# Patient Record
Sex: Female | Born: 1975 | Race: Black or African American | Hispanic: No | Marital: Married | Smoking: Current some day smoker
Health system: Southern US, Community
[De-identification: ages and names within clinical notes are randomized; demographics above are authoritative.]

## PROBLEM LIST (undated history)

## (undated) ENCOUNTER — Emergency Department (HOSPITAL_COMMUNITY): Payer: 59 | Source: Home / Self Care

## (undated) DIAGNOSIS — K219 Gastro-esophageal reflux disease without esophagitis: Secondary | ICD-10-CM

## (undated) DIAGNOSIS — R51 Headache: Secondary | ICD-10-CM

## (undated) DIAGNOSIS — K644 Residual hemorrhoidal skin tags: Secondary | ICD-10-CM

## (undated) DIAGNOSIS — M659 Synovitis and tenosynovitis, unspecified: Secondary | ICD-10-CM

## (undated) DIAGNOSIS — M65931 Unspecified synovitis and tenosynovitis, right forearm: Secondary | ICD-10-CM

## (undated) DIAGNOSIS — F419 Anxiety disorder, unspecified: Secondary | ICD-10-CM

## (undated) DIAGNOSIS — T7840XA Allergy, unspecified, initial encounter: Secondary | ICD-10-CM

## (undated) DIAGNOSIS — I10 Essential (primary) hypertension: Secondary | ICD-10-CM

## (undated) HISTORY — DX: Allergy, unspecified, initial encounter: T78.40XA

## (undated) HISTORY — PX: AUGMENTATION MAMMAPLASTY: SUR837

## (undated) HISTORY — PX: HEMORROIDECTOMY: SUR656

## (undated) HISTORY — DX: Headache: R51

## (undated) HISTORY — PX: ABDOMINAL HYSTERECTOMY: SHX81

## (undated) HISTORY — DX: Anxiety disorder, unspecified: F41.9

## (undated) HISTORY — DX: Residual hemorrhoidal skin tags: K64.4

---

## 1999-03-17 ENCOUNTER — Encounter: Admission: RE | Admit: 1999-03-17 | Discharge: 1999-03-25 | Payer: Self-pay

## 1999-09-23 ENCOUNTER — Inpatient Hospital Stay (HOSPITAL_COMMUNITY): Admission: AD | Admit: 1999-09-23 | Discharge: 1999-09-23 | Payer: Self-pay | Admitting: Obstetrics and Gynecology

## 1999-10-06 ENCOUNTER — Other Ambulatory Visit: Admission: RE | Admit: 1999-10-06 | Discharge: 1999-10-06 | Payer: Self-pay | Admitting: Obstetrics and Gynecology

## 2000-02-25 ENCOUNTER — Ambulatory Visit (HOSPITAL_COMMUNITY): Admission: RE | Admit: 2000-02-25 | Discharge: 2000-02-25 | Payer: Self-pay | Admitting: Obstetrics & Gynecology

## 2000-02-25 ENCOUNTER — Encounter: Payer: Self-pay | Admitting: Obstetrics & Gynecology

## 2000-04-15 ENCOUNTER — Inpatient Hospital Stay (HOSPITAL_COMMUNITY): Admission: AD | Admit: 2000-04-15 | Discharge: 2000-04-15 | Payer: Self-pay | Admitting: Obstetrics & Gynecology

## 2000-04-21 ENCOUNTER — Inpatient Hospital Stay (HOSPITAL_COMMUNITY): Admission: AD | Admit: 2000-04-21 | Discharge: 2000-04-23 | Payer: Self-pay | Admitting: Obstetrics and Gynecology

## 2000-06-18 ENCOUNTER — Emergency Department (HOSPITAL_COMMUNITY): Admission: EM | Admit: 2000-06-18 | Discharge: 2000-06-18 | Payer: Self-pay | Admitting: Emergency Medicine

## 2000-06-18 ENCOUNTER — Encounter: Payer: Self-pay | Admitting: Emergency Medicine

## 2000-08-04 ENCOUNTER — Emergency Department (HOSPITAL_COMMUNITY): Admission: EM | Admit: 2000-08-04 | Discharge: 2000-08-04 | Payer: Self-pay | Admitting: Emergency Medicine

## 2001-01-08 ENCOUNTER — Encounter: Payer: Self-pay | Admitting: Sports Medicine

## 2001-01-08 ENCOUNTER — Encounter: Admission: RE | Admit: 2001-01-08 | Discharge: 2001-01-08 | Payer: Self-pay | Admitting: Sports Medicine

## 2002-04-15 ENCOUNTER — Emergency Department (HOSPITAL_COMMUNITY): Admission: EM | Admit: 2002-04-15 | Discharge: 2002-04-15 | Payer: Self-pay | Admitting: Emergency Medicine

## 2005-10-11 ENCOUNTER — Encounter (INDEPENDENT_AMBULATORY_CARE_PROVIDER_SITE_OTHER): Payer: Self-pay | Admitting: Specialist

## 2005-10-11 ENCOUNTER — Encounter (INDEPENDENT_AMBULATORY_CARE_PROVIDER_SITE_OTHER): Payer: Self-pay | Admitting: *Deleted

## 2005-10-11 ENCOUNTER — Ambulatory Visit (HOSPITAL_COMMUNITY): Admission: AD | Admit: 2005-10-11 | Discharge: 2005-10-11 | Payer: Self-pay | Admitting: Obstetrics and Gynecology

## 2006-01-31 ENCOUNTER — Emergency Department (HOSPITAL_COMMUNITY): Admission: EM | Admit: 2006-01-31 | Discharge: 2006-01-31 | Payer: Self-pay | Admitting: Emergency Medicine

## 2007-02-15 HISTORY — PX: BREAST ENHANCEMENT SURGERY: SHX7

## 2007-04-25 IMAGING — CR DG FOOT EXAM
1 series · 3 of 3 positions shown · non-contrast
Comparison: NONE

CLINICAL DATA: Injury. 

RIGHT GREAT TOE

[Series 1: view not recorded · 0.17mm/px · 3 of 3 slices shown]
[im 1/3]
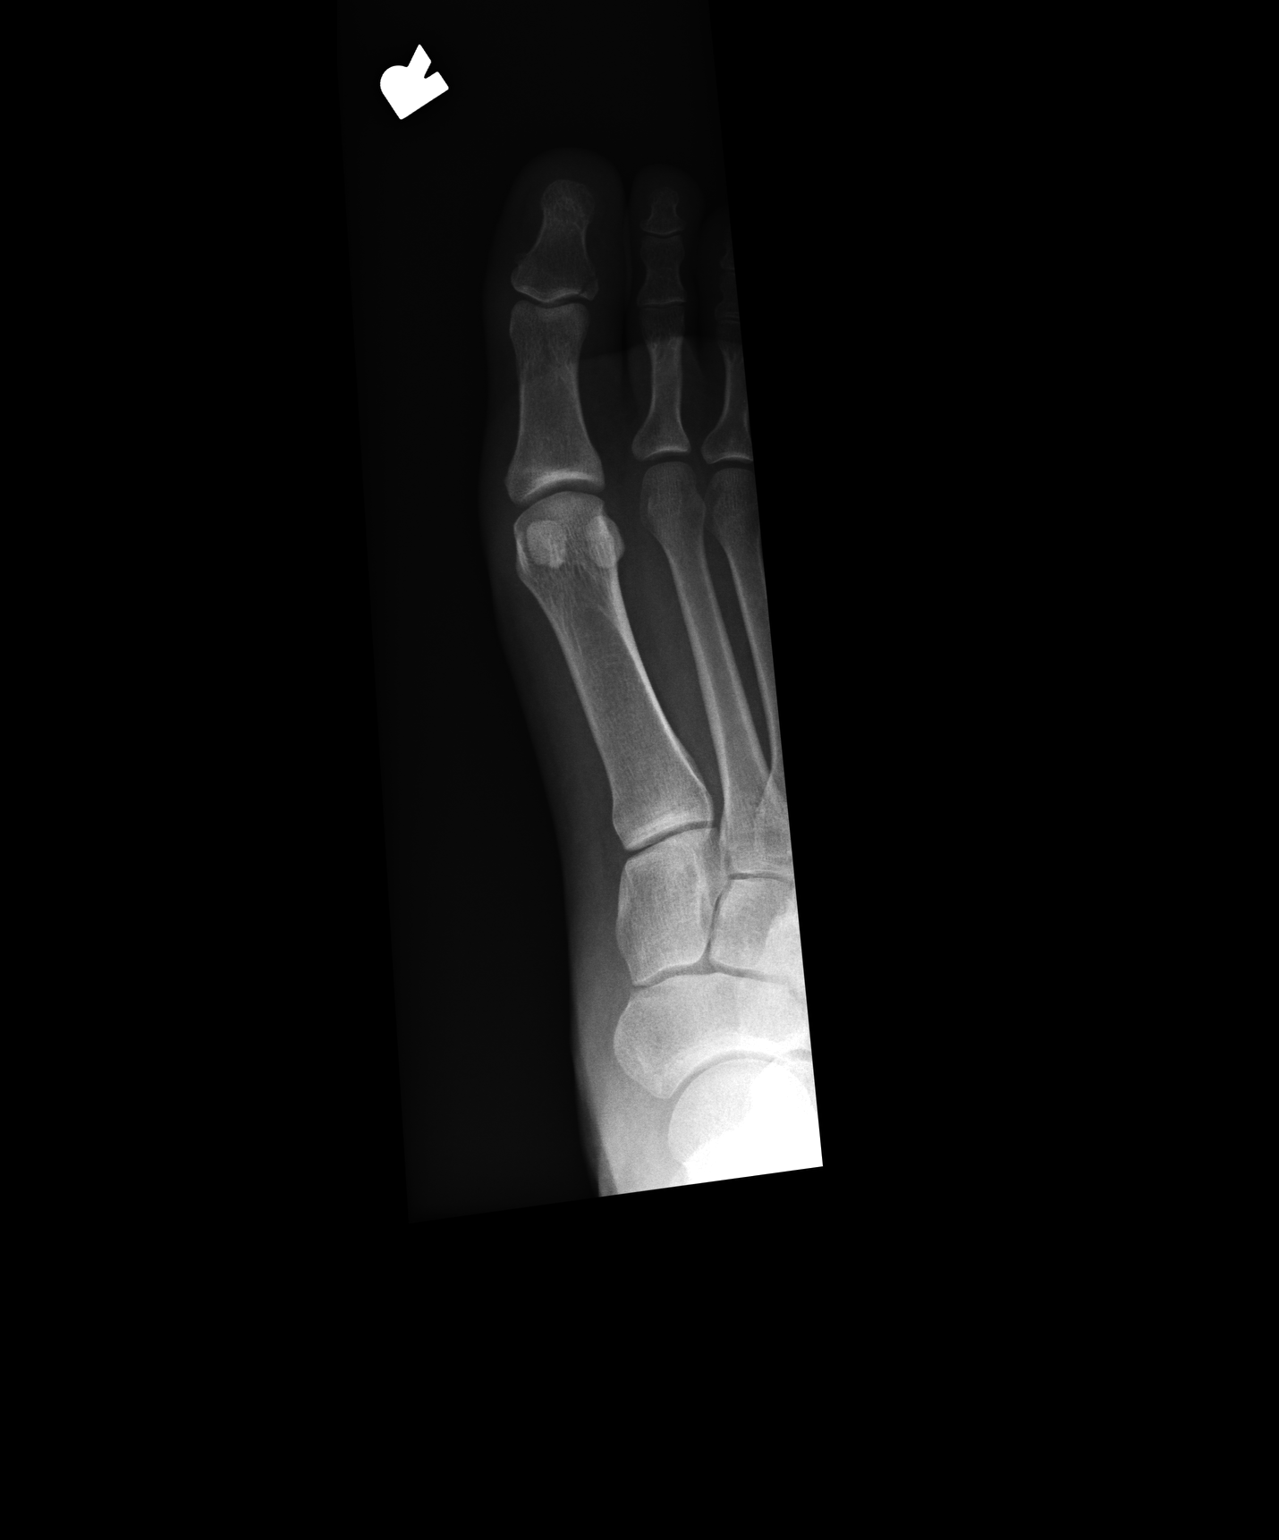
[im 2/3]
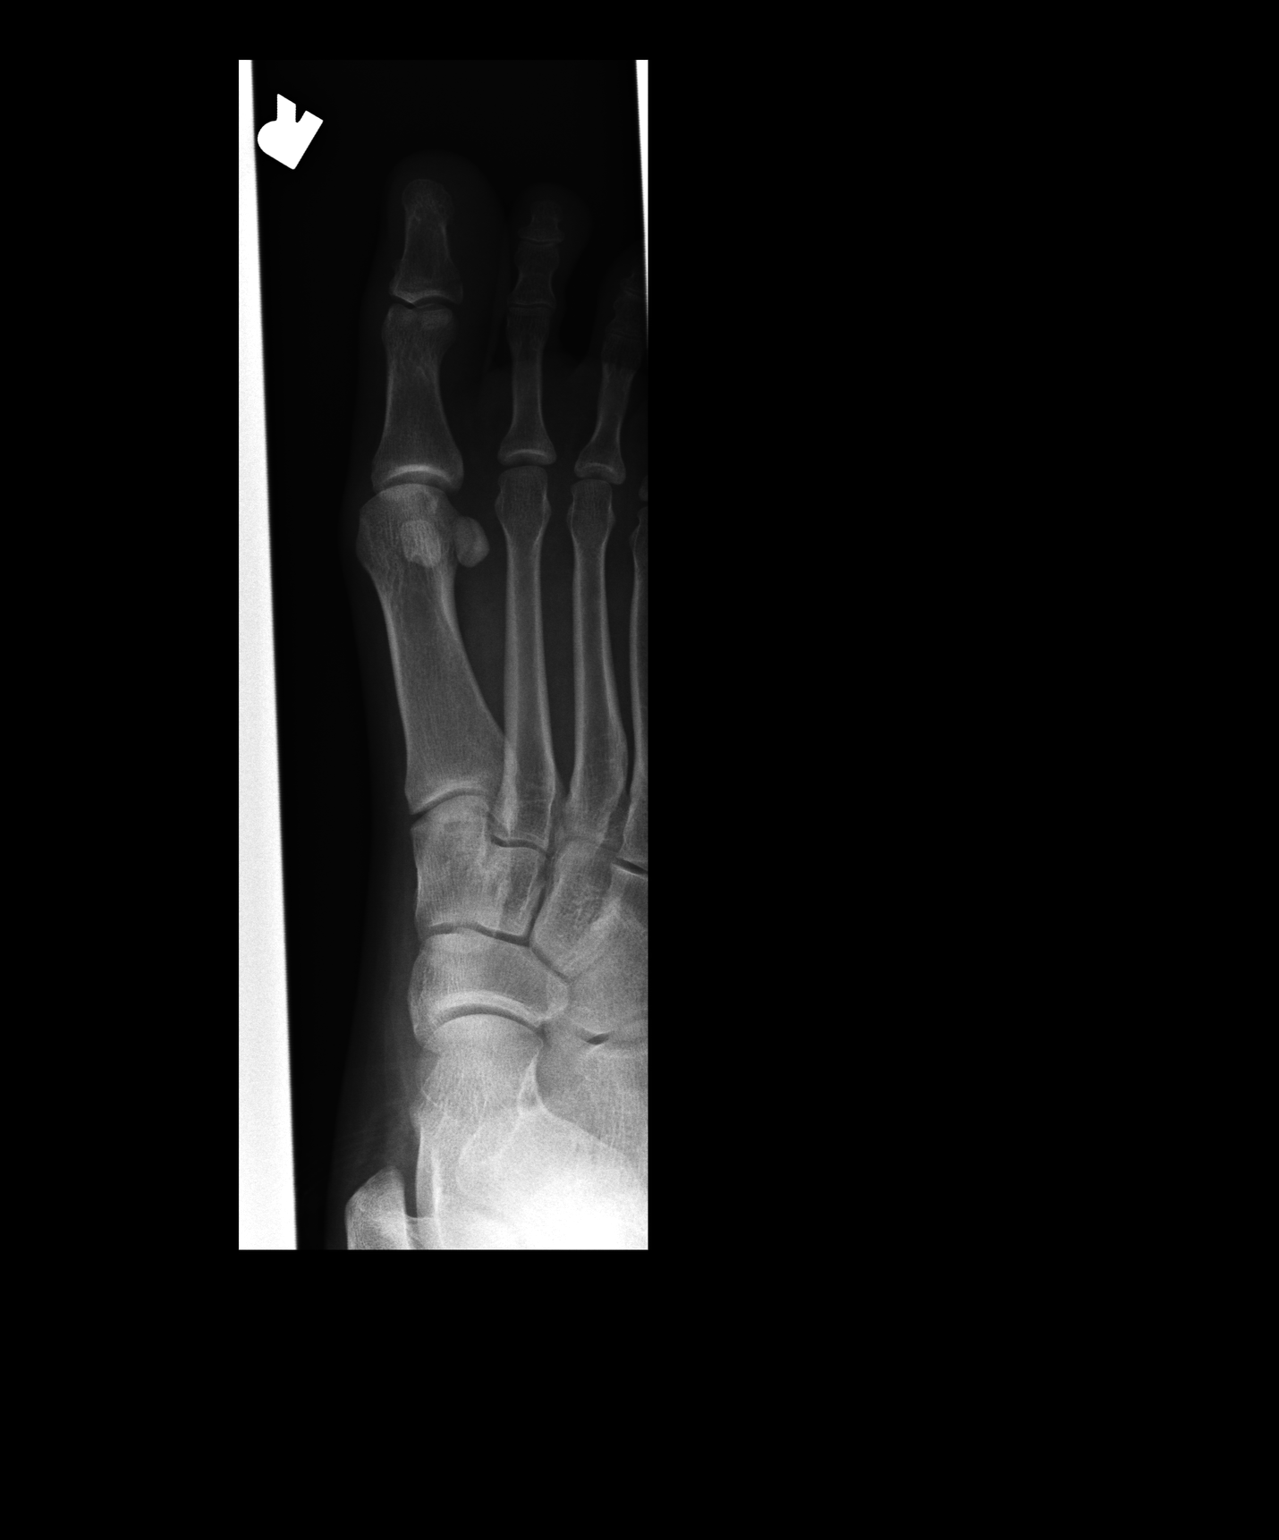
[im 3/3]
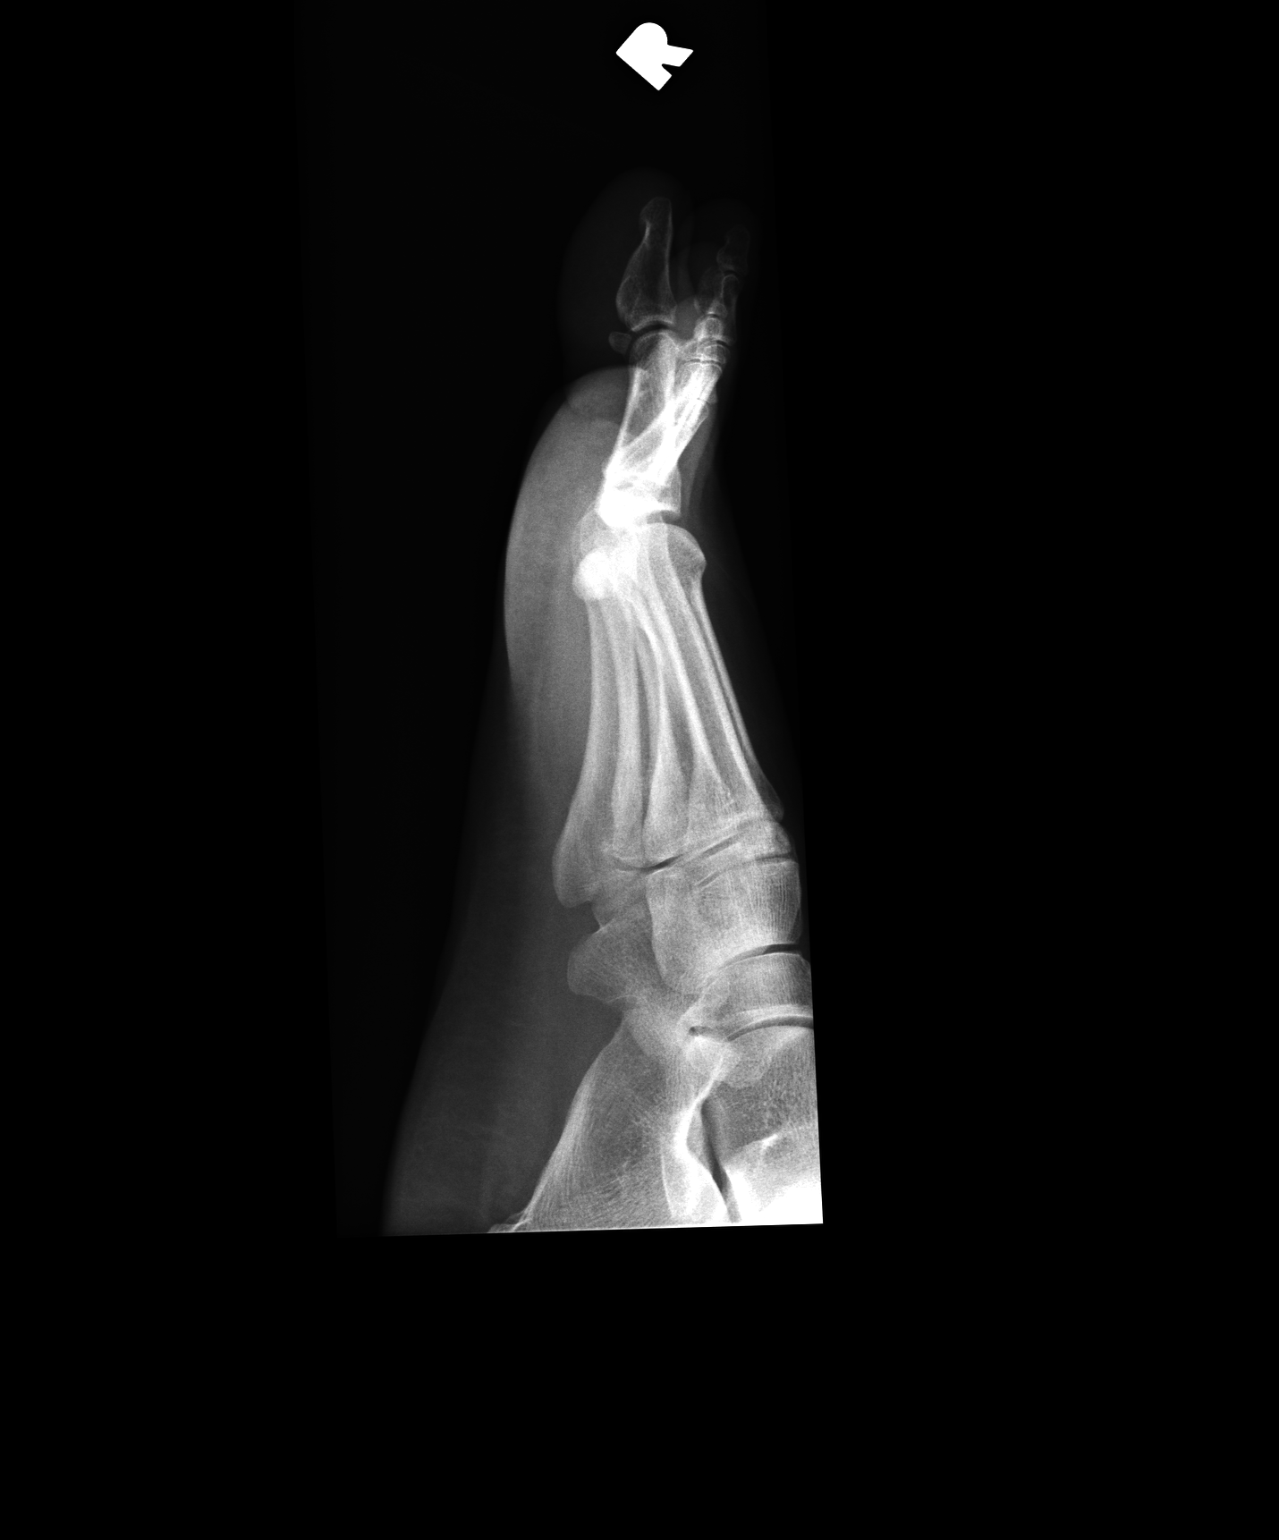

[3 of 3 positions shown; findings below may reference images not displayed]

FINDINGS: There is a non-displaced fracture at the base of the 
distal phalanx laterally, extending into the articular surface. 
The fracture fragment measures approximately 3-4 mm in size. No 
other evidence of fracture is identified.
IMPRESSION: Non-displaced distal phalanx fracture of the great 
toe. SEDLARSTVO electronically reviewed on [DATE] 
Dict Date: [DATE]  Tran Date:  [DATE] DAS  [REDACTED]

## 2009-03-11 ENCOUNTER — Ambulatory Visit: Payer: Self-pay | Admitting: Diagnostic Radiology

## 2009-03-11 ENCOUNTER — Emergency Department (HOSPITAL_BASED_OUTPATIENT_CLINIC_OR_DEPARTMENT_OTHER): Admission: EM | Admit: 2009-03-11 | Discharge: 2009-03-11 | Payer: Self-pay | Admitting: Emergency Medicine

## 2009-05-24 ENCOUNTER — Emergency Department (HOSPITAL_BASED_OUTPATIENT_CLINIC_OR_DEPARTMENT_OTHER): Admission: EM | Admit: 2009-05-24 | Discharge: 2009-05-24 | Payer: Self-pay | Admitting: Emergency Medicine

## 2009-05-26 ENCOUNTER — Emergency Department (HOSPITAL_COMMUNITY): Admission: EM | Admit: 2009-05-26 | Discharge: 2009-05-26 | Payer: Self-pay | Admitting: Family Medicine

## 2009-05-26 ENCOUNTER — Emergency Department (HOSPITAL_BASED_OUTPATIENT_CLINIC_OR_DEPARTMENT_OTHER): Admission: EM | Admit: 2009-05-26 | Discharge: 2009-05-26 | Payer: Self-pay | Admitting: Emergency Medicine

## 2010-02-14 HISTORY — PX: PARTIAL HYSTERECTOMY: SHX80

## 2010-04-29 ENCOUNTER — Encounter (HOSPITAL_COMMUNITY)
Admission: RE | Admit: 2010-04-29 | Discharge: 2010-04-29 | Disposition: A | Discharge status: Home / Self Care | Payer: 59 | Source: Ambulatory Visit | Attending: Obstetrics and Gynecology | Admitting: Obstetrics and Gynecology

## 2010-04-29 LAB — BASIC METABOLIC PANEL
GFR calc Af Amer: >60 mL/min (ref >60)
GFR calc non Af Amer: >60 mL/min (ref >60)
Potassium: 3.9 mEq/L (ref 3.5–5.1)
Sodium: 137 mEq/L (ref 135–145)

## 2010-04-29 LAB — CBC
Platelets: 225 10*3/uL (ref 150–400)
RDW: 13.1 % (ref 11.5–15.5)
WBC: 3.5 10*3/uL — ABNORMAL LOW (ref 4.0–10.5)

## 2010-04-29 LAB — SURGICAL PCR SCREEN: Staphylococcus aureus: NEGATIVE

## 2010-05-03 LAB — DIFFERENTIAL
Basophils Relative: 1 % (ref 0–1)
Lymphs Abs: 0.7 10*3/uL (ref 0.7–4.0)
Monocytes Relative: 14 % — ABNORMAL HIGH (ref 3–12)
Neutro Abs: 2 10*3/uL (ref 1.7–7.7)
Neutrophils Relative %: 62 % (ref 43–77)

## 2010-05-03 LAB — CBC
Platelets: 208 10*3/uL (ref 150–400)
RBC: 5.06 MIL/uL (ref 3.87–5.11)
WBC: 3.3 10*3/uL — ABNORMAL LOW (ref 4.0–10.5)

## 2010-05-03 LAB — D-DIMER, QUANTITATIVE: D-Dimer, Quant: 0.68 ug/mL-FEU — ABNORMAL HIGH (ref 0.00–0.48)

## 2010-05-06 ENCOUNTER — Ambulatory Visit (HOSPITAL_COMMUNITY)
Admission: RE | Admit: 2010-05-06 | Discharge: 2010-05-07 | Disposition: A | Discharge status: Home / Self Care | Payer: 59 | Source: Ambulatory Visit | Attending: Obstetrics and Gynecology | Admitting: Obstetrics and Gynecology

## 2010-05-06 ENCOUNTER — Other Ambulatory Visit: Payer: Self-pay | Admitting: Obstetrics and Gynecology

## 2010-05-06 DIAGNOSIS — N920 Excessive and frequent menstruation with regular cycle: Secondary | ICD-10-CM | POA: Insufficient documentation

## 2010-05-06 DIAGNOSIS — N8000 Endometriosis of the uterus, unspecified: Secondary | ICD-10-CM | POA: Insufficient documentation

## 2010-05-06 DIAGNOSIS — Z01818 Encounter for other preprocedural examination: Secondary | ICD-10-CM | POA: Insufficient documentation

## 2010-05-06 DIAGNOSIS — Z01812 Encounter for preprocedural laboratory examination: Secondary | ICD-10-CM | POA: Insufficient documentation

## 2010-05-06 DIAGNOSIS — IMO0002 Reserved for concepts with insufficient information to code with codable children: Secondary | ICD-10-CM | POA: Insufficient documentation

## 2010-05-06 LAB — HCG, SERUM, QUALITATIVE: Preg, Serum: NEGATIVE

## 2010-05-07 LAB — CBC
HCT: 35.4 % — ABNORMAL LOW (ref 36.0–46.0)
RDW: 13.2 % (ref 11.5–15.5)
WBC: 10 10*3/uL (ref 4.0–10.5)

## 2010-05-08 NOTE — H&P (Signed)
  NAMEBRUCHA, Judith [name]            ACCOUNT NO.:  [id number]  MEDICAL RECORD NO.:  [id number]        PATIENT TYPE:  WAMB  LOCATION:                                [facility]  PHYSICIAN:  Juluis Mire, M.D.        DATE OF BIRTH:  DATE OF ADMISSION:  05/06/2010 DATE OF DISCHARGE:                             HISTORY & PHYSICAL   The patient is a 35 year old gravida 4, para 3, abortus 2 female presents for LAVH.  In relation to present admission, the patient has been having increasing problems with menorrhagia.  Associated with this has been pain with intercourse during deep penetration.  Ultrasound evaluation has been high suggestive of adenomyosis.  We could not exclude other conditions such as endometriosis.  The symptoms of pain with intercourse and menorrhagia becoming limiting from the patient's standpoint.  We have discussed various options.  We have discussed the use of birth control pills, although that is somewhat limited by her tobacco use.  Discussed the use of a Marine IUD also discussed, ablated technique with associated laparoscopy more and then the more aggressive laparoscopic- assisted vaginal hysterectomy for which she is admitted at the present time.  In terms of allergy, has no known drug allergies.  MEDICATIONS:  She has been on hydrochlorothiazide.  PAST MEDICAL HISTORY:  Usual childhood diseases.  No significant sequelae.  No previous surgical history.  OBSTETRICAL HISTORY:  Three vaginal deliveries.  SOCIAL HISTORY:  One-pack per day tobacco use.  Occasional alcohol use.  FAMILY HISTORY:  Noncontributory.  REVIEW OF SYSTEMS:  Noncontributory.  PHYSICAL EXAMINATION:  GENERAL:  The patient is afebrile, stable vital signs. HEENT:  The patient is normocephalic.  Pupils equal, round, reactive to light and accommodation.  Extraocular movements were intact.  Sclerae and conjunctivae are clear.  Oropharynx clear. NECK:  Without thyromegaly. BREASTS:   Not examined. LUNGS:  Clear. CARDIOVASCULAR:  Regular rate without murmurs or gallops. ABDOMEN:  Benign.  No mass, organomegaly, or tenderness. PELVIC:  Normal external genitalia.  Vaginal mucosa is clear.  Cervix unremarkable.  Uterus normal size, shape, and contour.  Adnexa free of masses or tenderness. EXTREMITIES:  Trace edema. NEUROLOGIC:  Grossly within normal limits.  IMPRESSION:  Menorrhagia with associated dysmenorrhea secondary to adenomyosis.  PLAN:  The patient is to undergo laparoscopic-assisted vaginal hysterectomy.  Risks of surgery have been discussed, including the risk of infection.  The risk of hemorrhage that could require transfusion with associated risk of AIDS or hepatitis.  Risk of injury to adjacent organs including bladder, bowel, ureters that could require further exploratory surgery.  Risk of deep venous thrombosis and pulmonary embolus.  The patient expressed understanding of the indications and potential risk.     Juluis Mire, M.D.     JSM/MEDQ  D:  05/06/2010  T:  05/06/2010  Job:  [id number]  Electronically Signed by Richardean Chimera M.D. on 05/08/2010 06:06:10 AM

## 2010-05-08 NOTE — Op Note (Signed)
Judith [name], Judith [name]            ACCOUNT NO.:  [id number]  MEDICAL RECORD NO.:  [id number]           PATIENT TYPE:  O  LOCATION:  9318                          [facility]ACILITY:  [facility]  PHYSICIAN:  Juluis Mire, M.D.   DATE OF BIRTH:  [birth year]  DATE OF PROCEDURE:  05/06/2010 DATE OF DISCHARGE:                              OPERATIVE REPORT   PREOPERATIVE DIAGNOSES:  Menorrhagia and dyspareunia, probably secondary to adenomyosis.  POSTOPERATIVE DIAGNOSES:  Menorrhagia and dyspareunia, probably secondary to adenomyosis.  OPERATIVE PROCEDURE:  Laparoscopic-assisted vaginal hysterectomy.  SURGEON:  Juluis Mire, MD  ASSISTANT:  Freddy Finner, MD  ESTIMATED BLOOD LOSS:  400 mL.  Packs were none.  Drains included urethral Foley.  INTRAOPERATIVE BLOOD PLACED:  None.  COMPLICATIONS:  None.  INDICATIONS:  Dictated in the history and physical.  PROCEDURE IN DETAILS:  The patient was taken to the OR, placed in supine position.  After satisfactory level of general endotracheal anesthesia was obtained, the patient was placed in dorsal lithotomy position using the Allen stirrups.  Abdomen, perineum and vagina were prepped out with Betadine.  Bladder was emptied with in-and-out catheterization.  A Hulka tenaculum was put in place and secured.  The patient was draped in sterile field.  Subumbilical incision was made with a knife.  Veress needle was introduced into the abdominal cavity.  Abdomen was inflated with approximately 3 L of carbon dioxide.  Veress needle was then removed.  A 10/11 trocar was inserted.  Laparoscope was then inserted. Visualization revealed no evidence of injury to adjacent organs. Appendix was visualized and noted to be normal.  Upper abdomen including the liver, tip of the gallbladder were clear.  Uterus, tubes and ovaries were unremarkable.  A 5-mm trocar was put in place in suprapubic area under direct visualization.  Using the EnSeal, the  utero-ovarian pedicles and round ligaments on each sides were cauterized and incised. We had good separation of the uterus.  At this point in time we decided to go vaginally.  Laparoscope was removed.  The abdomen was deinflated with carbon dioxide.  Legs were repositioned.  The Hulka tenaculum was removed.  A weighted speculum was placed in vaginal vault.  Cul-de-sac was entered sharply.  Both uterosacral ligaments were clamped, cut and suture ligated with 0 Vicryl.  Reflection of the vaginal mucosa anteriorly was incised.  Bladder was dissected superiorly.  Paracervical tissue was clamped, cut and suture ligated with 0 Vicryl.  Vesicouterine space was identified and entered sharply.  Retractor was put in place. Using a clamp, cut and tie technique with suture ligature of 0 Vicryl, the parametrium was serially separated in size of the uterus.  Uterus was then flipped, remaining pedicles were clamped and cut.  Uterus, cervix were passed off the operative field.  Held pedicles were secured with free ties of 0 Vicryl.  Areas of bleeding brought under controlwith figure-of-eights of 0 Vicryl.  Vaginal mucosa was then closed with interrupted figure-of-eights of 2-0 Monocryl.  At this point in time Foley was placed to straight drain.  Urine output was clear and adequate.  The patient's legs were repositioned.  Laparoscope was reintroduced.  We irrigated the pelvis.  We had some oozing from the vaginal cuff, brought under control with the bipolar.  Both ovaries seemed to be hemostatically intact.  At this point in time we deflated the abdomen and reinflated, so no further bleeding.  The abdomen was then deinflated with carbon dioxide.  All trocars were removed. Subumbilical incision was closed with interrupted subcuticulars of 4-0 Vicryl.  Suprapubic incision was closed with Dermabond.  The patient was taken out of dorsal lithotomy position.  Once alert and extubated, transferred to recovery  room in good condition.  Sponge, instrument and needle count was reported as correct by circulating nurse x2.     Juluis Mire, M.D.     JSM/MEDQ  D:  05/06/2010  T:  05/07/2010  Job:  [id number]  Electronically Signed by Richardean Chimera M.D. on 05/08/2010 06:06:12 AM

## 2010-05-08 NOTE — Discharge Summary (Signed)
  [name], [name]            ACCOUNT NO.:  [id number]  MEDICAL RECORD NO.:  [id number]           PATIENT TYPE:  O  LOCATION:  9318                          [facility]ILITY:  [facility]  PHYSICIAN:  Juluis Mire, M.D.   DATE OF BIRTH:  August 11, 1975  DATE OF ADMISSION:  05/06/2010 DATE OF DISCHARGE:  05/07/2010                              DISCHARGE SUMMARY   ADMITTING DIAGNOSES:  Menorrhagia and dysmenorrhea secondary to adenomyosis.  DISCHARGE DIAGNOSIS:  Menorrhagia and dysmenorrhea secondary to adenomyosis.  OPERATIVE PROCEDURE:  Laparoscopic-assisted vaginal hysterectomy.  For complete history and physical, please see dictated note.  COURSE IN THE HOSPITAL:  The patient underwent the above-noted surgery. She did well and was discharged home on her first postop day.  At that time, her hemoglobin was 11.6.  She was afebrile, stable vital signs, and abdomen was soft with active bowel sounds.  She was having minimal vaginal bleeding.  Her urine output the night had been adequate.  All incisions were clear.  COMPLICATION:  In terms of complications,  none were encountered in the hospital, the patient was discharged home in stable condition.  DISPOSITION:  The patient is to avoid heavy lifting, vaginal entry, or driving a car.  Discharged home on Percocet, she needs it for pain as well as the stool softener.  She is instructed to call should there be signs of infection with fever.  She should call with nausea, vomiting, active vaginal bleeding, or excessive pain.  Also discussed signs and symptoms of deep venous thrombosis or pulmonary embolus.     Juluis Mire, M.D.     JSM/MEDQ  D:  05/07/2010  T:  05/08/2010  Job:  [id number]  Electronically Signed by Richardean Chimera M.D. on 05/08/2010 06:06:15 AM

## 2010-05-09 ENCOUNTER — Inpatient Hospital Stay (HOSPITAL_COMMUNITY)
Admission: AD | Admit: 2010-05-09 | Discharge: 2010-05-09 | Disposition: A | Discharge status: Home / Self Care | Payer: 59 | Source: Ambulatory Visit | Attending: Obstetrics and Gynecology | Admitting: Obstetrics and Gynecology

## 2010-05-09 DIAGNOSIS — N9989 Other postprocedural complications and disorders of genitourinary system: Secondary | ICD-10-CM

## 2010-05-09 DIAGNOSIS — IMO0002 Reserved for concepts with insufficient information to code with codable children: Secondary | ICD-10-CM | POA: Insufficient documentation

## 2010-05-09 DIAGNOSIS — N390 Urinary tract infection, site not specified: Secondary | ICD-10-CM | POA: Insufficient documentation

## 2010-05-09 LAB — URINALYSIS, ROUTINE W REFLEX MICROSCOPIC
Hgb urine dipstick: NEGATIVE
Nitrite: POSITIVE — AB
Protein, ur: NEGATIVE mg/dL
Specific Gravity, Urine: 1.015 (ref 1.005–1.030)
Urobilinogen, UA: 1 mg/dL (ref 0.0–1.0)

## 2010-05-09 LAB — URINE MICROSCOPIC-ADD ON

## 2010-05-10 ENCOUNTER — Inpatient Hospital Stay (HOSPITAL_COMMUNITY): Payer: 59

## 2010-05-10 ENCOUNTER — Observation Stay (HOSPITAL_COMMUNITY)
Admission: AD | Admit: 2010-05-10 | Discharge: 2010-05-11 | Disposition: A | Discharge status: Home / Self Care | Payer: 59 | Source: Ambulatory Visit | Attending: Obstetrics and Gynecology | Admitting: Obstetrics and Gynecology

## 2010-05-10 DIAGNOSIS — K560 Paralytic ileus: Secondary | ICD-10-CM | POA: Insufficient documentation

## 2010-05-10 DIAGNOSIS — R109 Unspecified abdominal pain: Secondary | ICD-10-CM | POA: Insufficient documentation

## 2010-05-10 DIAGNOSIS — Y838 Other surgical procedures as the cause of abnormal reaction of the patient, or of later complication, without mention of misadventure at the time of the procedure: Secondary | ICD-10-CM | POA: Insufficient documentation

## 2010-05-10 DIAGNOSIS — K929 Disease of digestive system, unspecified: Principal | ICD-10-CM | POA: Insufficient documentation

## 2010-05-10 LAB — DIFFERENTIAL
Basophils Absolute: 0 10*3/uL (ref 0.0–0.1)
Basophils Relative: 0 % (ref 0–1)
Lymphocytes Relative: 7 % — ABNORMAL LOW (ref 12–46)
Neutro Abs: 11.8 10*3/uL — ABNORMAL HIGH (ref 1.7–7.7)
Neutrophils Relative %: 86 % — ABNORMAL HIGH (ref 43–77)

## 2010-05-10 LAB — URINALYSIS, ROUTINE W REFLEX MICROSCOPIC
Glucose, UA: NEGATIVE mg/dL
Hgb urine dipstick: NEGATIVE
Protein, ur: NEGATIVE mg/dL
Specific Gravity, Urine: 1.025 (ref 1.005–1.030)

## 2010-05-10 LAB — URINE MICROSCOPIC-ADD ON

## 2010-05-10 LAB — CBC
HCT: 35.7 % — ABNORMAL LOW (ref 36.0–46.0)
Hemoglobin: 12.1 g/dL (ref 12.0–15.0)
MCHC: 33.9 g/dL (ref 30.0–36.0)
RBC: 4.39 MIL/uL (ref 3.87–5.11)
WBC: 13.7 10*3/uL — ABNORMAL HIGH (ref 4.0–10.5)

## 2010-05-11 LAB — URINE CULTURE

## 2010-07-02 NOTE — H&P (Signed)
Judith [name], Judith [name]            ACCOUNT NO.:  [id number]   MEDICAL RECORD NO.:  [id number]          PATIENT TYPE:  MAT   LOCATION:  [facility]                          [facility]ILITY:  [facility]   PHYSICIAN:  Juluis Mire, M.D.   DATE OF BIRTH:  February 03, 1976   DATE OF ADMISSION:  10/11/2005  DATE OF DISCHARGE:                                HISTORY & PHYSICAL   The patient is a 35 year old gravida 4, para 3 female last menstrual period  of June 1 and estimated date of confinement March 8 and estimated  gestational age of approximately 11 weeks.  She was seen in the office on  August 24 complaining of bleeding. Ultrasound revealed a fetal pole  measuring 8 weeks and 6 days, no cardiac activity was seen.  This was  consistent with a nonviable first trimester pregnancy and the patient now  presents for dilation and evacuation.   ALLERGIES:  NO KNOWN DRUG ALLERGIES.   MEDICATIONS:  Prenatal vitamins.   PRENATAL LABS:  Significant in that she is O positive.  Rhogam will not be  required.   PAST MEDICAL HISTORY:  She had usual childhood diseases, no significant  sequelae.  She has no significant surgical history.  She has had three  previous vaginal deliveries.   FAMILY HISTORY:  Noncontributory.   SOCIAL HISTORY:  No tobacco or alcohol use at present time.   REVIEW OF SYSTEMS:  Noncontributory.   PHYSICAL EXAMINATION:  VITAL SIGNS:  The patient is afebrile with stable  vital signs.  HEENT:  Normocephalic.  Pupils equal, round and reactive to light and  accommodation.  Extraocular movements are intact.  Sclerae and conjunctivae  clear.  Oropharynx clear.  NECK:  Without thyromegaly.  BREASTS:  No discrete masses but very glandular.  LUNGS:  Clear.  HEART:  Regular rate and rhythm without murmurs or gallops.  ABDOMEN:  Benign.  No mass, organomegaly or tenderness.  PELVIC:  Normal external genitalia.  Vaginal mucosa clear.  Cervix  unremarkable, uterus 8 weeks in size.  Adnexa unremarkable.  EXTREMITIES:  Trace edema.  NEUROLOGIC:  Grossly within normal limits.   IMPRESSION:  Nonviable first trimester pregnancy.   PLAN:  The patient will undergo dilation and evacuation. The risks have been  discussed including the risks of infection, the risks of hemorrhage that  could require transfusion with the risks of AIDs or hepatitis, uncontrolled  bleeding could lead to hysterectomy.  There is a risk of perforation leading  to injury to adjacent organs, specifically bowel that could require further  exploratory surgery.  There is a risk of deep venous thrombosis and  pulmonary embolus.  Again, the patient's blood type is O positive.  Rhogam  will not be required.      Juluis Mire, M.D.  Electronically Signed     JSM/MEDQ  D:  10/11/2005  T:  10/11/2005  Job:  [id number]

## 2010-07-02 NOTE — Op Note (Signed)
Judith [name], Judith [name]            ACCOUNT NO.:  [id number]   MEDICAL RECORD NO.:  [id number]          PATIENT TYPE:  AMB   LOCATION:  SDC                           [facility]   PHYSICIAN:  Juluis Mire, M.D.   DATE OF BIRTH:  10-01-1975   DATE OF PROCEDURE:  10/11/2005  DATE OF DISCHARGE:                                 OPERATIVE REPORT   PREOPERATIVE DIAGNOSIS:  Retained products of conception.   POSTOPERATIVE DIAGNOSIS:  Retained products of conception.   PROCEDURE:  Dilatation and evacuation.   SURGEON:  Juluis Mire, M.D.   ANESTHESIA:  Sedation.   ESTIMATED BLOOD LOSS:  Minimal.   PACKS/DRAINS:  None.   BLOOD REPLACED:  None.   INDICATIONS FOR PROCEDURE:  The patient is a 35 year old, gravida 4, para 3  female who was diagnosed with a nonviable intrauterine fetal demise. She was  scheduled for a D&E lunch, reported to [facility] with increasing bleeding and  passage of the products of conception. Ultrasound was performed and still  revealed retained products. She now presents for D&E. Blood type is O  positive. The risks were discussed including the risk of infection. The risk  of hemorrhage that could require transfusion, risk of AIDS of hepatitis,  risk of perforation that could lead to injury to adjacent organs requiring  exploratory surgery. The risk of deep venous thrombosis and pulmonary  embolus.   DESCRIPTION OF PROCEDURE:  The patient was taken to the OR and placed in  supine position and after sedation was placed in the dorsal lithotomy  position. The patient was draped in a sterile field, a speculum was placed  in the vaginal vault. The cervix was and vagina cleansed with Betadine.  Paracervical block was instituted using 1% Xylocaine, the cervix was grasped  with a single tooth tenaculum, uterus sounded to 8 cm. A size 8 curved  suction curette was introduced, no dilatation was required. We did obtain a  moderate amount of tissue and clots. We continued  this until no additional  tissue was obtained. Sharp curetting revealed the arteries to be clear. At  this point  in time, the speculum and single tooth tenaculum were removed, the patient  taken out of the dorsal lithotomy position and once alert transferred to the  recovery room in good condition. Sponge, instrument and needle count  reported as correct by circulating nurse x2.      Juluis Mire, M.D.  Electronically Signed     JSM/MEDQ  D:  10/11/2005  T:  10/11/2005  Job:  [id number]

## 2011-01-31 ENCOUNTER — Ambulatory Visit (INDEPENDENT_AMBULATORY_CARE_PROVIDER_SITE_OTHER): Payer: 59

## 2011-01-31 DIAGNOSIS — S60229A Contusion of unspecified hand, initial encounter: Secondary | ICD-10-CM

## 2011-01-31 DIAGNOSIS — M25549 Pain in joints of unspecified hand: Secondary | ICD-10-CM

## 2011-03-22 ENCOUNTER — Emergency Department (INDEPENDENT_AMBULATORY_CARE_PROVIDER_SITE_OTHER)
Admission: EM | Admit: 2011-03-22 | Discharge: 2011-03-22 | Disposition: A | Discharge status: Home / Self Care | Payer: Worker's Compensation | Source: Home / Self Care

## 2011-03-22 ENCOUNTER — Emergency Department (HOSPITAL_COMMUNITY): Payer: Self-pay

## 2011-03-22 ENCOUNTER — Encounter (HOSPITAL_COMMUNITY): Payer: Self-pay | Admitting: Cardiology

## 2011-03-22 ENCOUNTER — Emergency Department (INDEPENDENT_AMBULATORY_CARE_PROVIDER_SITE_OTHER): Payer: Worker's Compensation

## 2011-03-22 DIAGNOSIS — S8992XA Unspecified injury of left lower leg, initial encounter: Secondary | ICD-10-CM

## 2011-03-22 DIAGNOSIS — S99919A Unspecified injury of unspecified ankle, initial encounter: Secondary | ICD-10-CM

## 2011-03-22 DIAGNOSIS — S99929A Unspecified injury of unspecified foot, initial encounter: Secondary | ICD-10-CM

## 2011-03-22 MED ORDER — HYDROCODONE-ACETAMINOPHEN 5-325 MG PO TABS: 1.0000 | ORAL_TABLET | Freq: Four times a day (QID) | ORAL | Status: AC | PRN

## 2011-03-22 MED ORDER — HYDROCODONE-ACETAMINOPHEN 5-325 MG PO TABS: 2.0000 | ORAL_TABLET | ORAL | Status: DC | PRN

## 2011-03-22 MED ORDER — DICLOFENAC SODIUM 75 MG PO TBEC: 75.0000 mg | DELAYED_RELEASE_TABLET | Freq: Two times a day (BID) | ORAL | Status: AC

## 2011-03-22 NOTE — ED Notes (Signed)
Pt had twisting injury to left knee around 230pm today. She was loading trunk and turn one direction and her knee didn't. Denies numbness in left foot. Increased pain when bearing weight on left leg.

## 2011-03-22 NOTE — ED Provider Notes (Signed)
History     CSN: [id number]  Arrival date & time 03/22/11  1923   None     Chief Complaint  Patient presents with  . Knee Pain    (Consider location/radiation/quality/duration/timing/severity/associated sxs/prior treatment) HPI Comments: Pt presents with c/o Lt knee pain. She had a twisting injury to the knee this afternoon, and felt a pop. She has pain in medial aspect of knee. No prior history of knee problems. Pain worsens with weight bearing and extension. She has not taken anything for her discomfort.    History reviewed. No pertinent past medical history.  Past Surgical History  Procedure Date  . Vaginal delivery 1996, 1998, 2002  . Partial hysterectomy 2012    History reviewed. No pertinent family history.  History  Substance Use Topics  . Smoking status: Current Some Day Smoker -- 0.2 packs/day    Types: Cigarettes  . Smokeless tobacco: Not on file  . Alcohol Use: Yes     occas    OB History    Grav Para Term Preterm Abortions TAB SAB Ect Mult Living                  Review of Systems  Constitutional: Negative for fever and chills.  Musculoskeletal: Negative for joint swelling.  Skin: Negative for color change and wound.  Neurological: Negative for weakness and numbness.    Allergies  Review of patient's allergies indicates no known allergies.  Home Medications   Current Outpatient Rx  Name Route Sig Dispense Refill  . DICLOFENAC SODIUM 75 MG PO TBEC Oral Take 1 tablet (75 mg total) by mouth 2 (two) times daily. 20 tablet 0  . HYDROCODONE-ACETAMINOPHEN 5-325 MG PO TABS Oral Take 1 tablet by mouth every 6 (six) hours as needed for pain. 10 tablet 0    BP 118/87  Temp(Src) 98.7 F (37.1 C) (Oral)  Resp 16  SpO2 97%  Physical Exam  Nursing note and vitals reviewed. Constitutional: She appears well-developed and well-nourished. No distress.  HENT:  Head: Atraumatic.  Musculoskeletal:       Left knee: She exhibits bony tenderness (medial  superior tibia immediately inferior to joint line). She exhibits normal range of motion, no swelling, no effusion, no ecchymosis, no deformity, no laceration, no erythema, normal alignment, no LCL laxity, normal patellar mobility and no MCL laxity. tenderness found. Medial joint line tenderness noted. No lateral joint line, no MCL, no LCL and no patellar tendon tenderness noted.  Neurological: She is alert.  Skin: Skin is warm and dry. No rash noted. No erythema.  Psychiatric: She has a normal mood and affect.    ED Course  Procedures (including critical care time)  Labs Reviewed - No data to display No results found.   1. Left knee injury       MDM  Knee xray neg. Suspect medial meniscus injury based on mechanism of injury, symptoms and exam.         Melody Comas, PA 03/22/11 2209

## 2011-03-23 NOTE — ED Provider Notes (Signed)
Medical screening examination/treatment/procedure(s) were performed by non-physician practitioner and as supervising physician I was immediately available for consultation/collaboration.   [facility]; MD   Sharin Grave, MD 03/23/11 [phone]

## 2011-03-26 ENCOUNTER — Ambulatory Visit (INDEPENDENT_AMBULATORY_CARE_PROVIDER_SITE_OTHER): Payer: 59 | Admitting: Physician Assistant

## 2011-03-26 VITALS — BP 115/76 | HR 91 | Temp 99.2°F | Resp 16 | Ht 63.75 in | Wt 164.2 lb

## 2011-03-26 DIAGNOSIS — R05 Cough: Secondary | ICD-10-CM

## 2011-03-26 LAB — POCT CBC
Granulocyte percent: 76.9 %G (ref 37–80)
Hemoglobin: 13.6 g/dL (ref 12.2–16.2)
MCV: 86.3 fL (ref 80–97)
MID (cbc): 0.5 (ref 0–0.9)
MPV: 9.4 fL (ref 0–99.8)
POC MID %: 9.3 %M (ref 0–12)
Platelet Count, POC: 216 10*3/uL (ref 142–424)
RBC: 4.91 M/uL (ref 4.04–5.48)
WBC: 5.4 10*3/uL (ref 4.6–10.2)

## 2011-03-26 MED ORDER — HYDROCOD POLST-CHLORPHEN POLST 10-8 MG/5ML PO LQCR: 5.0000 mL | Freq: Two times a day (BID) | ORAL | Status: DC

## 2011-03-26 NOTE — Progress Notes (Signed)
  Subjective:    Patient ID: Judith [name], female    DOB: [birth year], 36 y.o.   MRN: [id number]  Sore Throat  This is a new (went running on Monday and this started right afterwards - has h/o allergies buit this feels completely different) problem. The current episode started in the past 7 days (started on Monday - getting worse as her cough worsens). The problem has been gradually worsening. Maximum temperature: subj fever. Associated symptoms include coughing. Pertinent negatives include no congestion, diarrhea, ear pain, headaches, shortness of breath or vomiting. Treatments tried: Mucinex - ? help.  Cough This is a new problem. The current episode started in the past 7 days (last 5 days). The problem has been gradually worsening. The problem occurs constantly (worse at night). The cough is non-productive. Associated symptoms include chest pain (2nd to cough - musculoskeletal), chills, postnasal drip and a sore throat (worse with cough). Pertinent negatives include no ear pain, fever, headaches, nasal congestion, rhinorrhea or shortness of breath. The symptoms are aggravated by nothing. She has tried OTC cough suppressant for the symptoms. The treatment provided mild relief.      Review of Systems  Constitutional: Positive for chills. Negative for fever.  HENT: Positive for sore throat (worse with cough) and postnasal drip. Negative for ear pain, congestion and rhinorrhea.   Respiratory: Positive for cough. Negative for shortness of breath.   Cardiovascular: Positive for chest pain (2nd to cough - musculoskeletal).  Gastrointestinal: Negative for vomiting and diarrhea.  Neurological: Negative for headaches.       Objective:   Physical Exam  Constitutional: She is oriented to person, place, and time. She appears well-developed and well-nourished.  HENT:  Head: Normocephalic and atraumatic.  Right Ear: Tympanic membrane, external ear and ear canal normal.  Left Ear: Tympanic membrane,  external ear and ear canal normal.  Nose: Nose normal. No rhinorrhea.  Neck: Normal range of motion.  Cardiovascular: Normal rate, regular rhythm and normal heart sounds.   Pulmonary/Chest: Effort normal and breath sounds normal.  Lymphadenopathy:    She has no cervical adenopathy.  Neurological: She is alert and oriented to person, place, and time.  Skin: Skin is warm and dry.  Psychiatric: She has a normal mood and affect. Her behavior is normal. Judgment and thought content normal.          Assessment & Plan:  ? Flu but due to length of illness will treat symptoms - if pts cough becomes productive and stays that way for the next 5 days a call for Abx because of smoking hx.  If she calls back zpack.  1. Cough  POCT CBC, chlorpheniramine-HYDROcodone (TUSSIONEX PENNKINETIC ER) 10-8 MG/5ML [facility]

## 2011-03-30 ENCOUNTER — Ambulatory Visit (INDEPENDENT_AMBULATORY_CARE_PROVIDER_SITE_OTHER): Payer: 59 | Admitting: Physician Assistant

## 2011-03-30 VITALS — BP 114/72 | HR 80 | Temp 98.0°F | Resp 16 | Ht 63.5 in | Wt 160.4 lb

## 2011-03-30 DIAGNOSIS — J9801 Acute bronchospasm: Secondary | ICD-10-CM

## 2011-03-30 DIAGNOSIS — R05 Cough: Secondary | ICD-10-CM

## 2011-03-30 MED ORDER — ALBUTEROL SULFATE HFA 108 (90 BASE) MCG/ACT IN AERS: 2.0000 | INHALATION_SPRAY | RESPIRATORY_TRACT | Status: DC | PRN

## 2011-03-30 MED ORDER — ALBUTEROL SULFATE (2.5 MG/3ML) 0.083% IN NEBU: 2.5000 mg | INHALATION_SOLUTION | Freq: Once | RESPIRATORY_TRACT | Status: DC

## 2011-03-30 MED ORDER — ALBUTEROL SULFATE (2.5 MG/3ML) 0.083% IN NEBU
2.5000 mg | INHALATION_SOLUTION | Freq: Once | RESPIRATORY_TRACT | Status: AC
  Administered 2011-03-30 (×3): 2.5 mg via RESPIRATORY_TRACT

## 2011-03-30 MED ORDER — AZITHROMYCIN 250 MG PO TABS: ORAL_TABLET | ORAL | Status: AC

## 2011-03-30 MED ORDER — HYDROCOD POLST-CHLORPHEN POLST 10-8 MG/5ML PO LQCR: 5.0000 mL | Freq: Two times a day (BID) | ORAL | Status: DC

## 2011-03-30 MED ORDER — PREDNISONE 10 MG PO TABS: ORAL_TABLET | ORAL | Status: DC

## 2011-03-30 NOTE — Progress Notes (Signed)
  Subjective:    Patient ID: Judith [name], female    DOB: 08/16/1975, 36 y.o.   MRN: [id number]  HPI Ms. Hammerschmidt is here for recheck from last week.  ST and Cough have not improved much. Has been using Tussionex and Mucinex. She has SOB, wheezing, tightness, paroxysmal cough. No f/c. Has no h/o asthma but has used inhalers for URI in past.  Non smoker  Review of Systems  Constitutional: Positive for fatigue. Negative for fever and chills.  HENT: Positive for sore throat. Negative for congestion.   Respiratory: Positive for cough, shortness of breath and wheezing. Negative for chest tightness.   Cardiovascular: Negative for chest pain.  Gastrointestinal: Negative for nausea and vomiting.  Musculoskeletal: Positive for myalgias.  Neurological: Positive for headaches.       Objective:   Physical Exam  Constitutional: Vital signs are normal.  HENT:  Right Ear: Tympanic membrane normal.  Left Ear: Tympanic membrane normal.  Nose: No mucosal edema.  Mouth/Throat: Posterior oropharyngeal erythema present. No posterior oropharyngeal edema.  Cardiovascular: Normal rate and regular rhythm.   Pulmonary/Chest: She has wheezes in the right upper field, the right middle field, the right lower field, the left upper field and the left lower field.    Albuterol Nebulizer administered x 2 with significant relief.      Assessment & Plan:   Cough Bronchospasm  Add Pro Air q 4-6 hours Prednisone 6 to1 taper Zpack to hold for now. Fill if no improvement 2 days. Out of work. Return 2/16 Call tomorrow with status.

## 2011-03-30 NOTE — Patient Instructions (Signed)
Use inhaler every 4-6 hours Take Mucinex DM Call tomorrow with status. Return if symptoms worsen.

## 2011-03-31 ENCOUNTER — Telehealth: Payer: Self-pay

## 2011-03-31 NOTE — Telephone Encounter (Signed)
.  [facility] Judith [name] WANTED TO KNOW HOW PT IS DOING AND SHE SLEPT PRETTY GOOD LAST NIGHT AND WAS FEELING GOOD THIS MORNING, BUT NOW HER COUGH HAVE STARTED BACK AGAIN PLEASE CALL [phone]   [facility] ON [address]

## 2011-03-31 NOTE — Telephone Encounter (Signed)
Any new instructions for pt?

## 2011-04-01 NOTE — Telephone Encounter (Signed)
Make sure she is using Albuterol every 4 hours.  That she is taking her prednisone.  If she hasn't started Zpack yet, do so no.  If doing all of the above, may need to bump her prednisone up a little and/or have her come in for nebulizer tx.

## 2011-04-04 ENCOUNTER — Other Ambulatory Visit: Payer: Self-pay | Admitting: Physician Assistant

## 2011-04-04 NOTE — Telephone Encounter (Signed)
Pt states she does feel better than when she was in at [facility], but is still really tired and has coughing attacks that last 30 mins. She just finished her pred, but did not take her z-pack. Instructed pt to start her z-pack. She is using her albuterol q 4 hrs, still has some wheezing intermittently even when not coughing. Has been out of cough med for a couple days and said it did help but didn't completely stop her coughing attacks. Do you want to RF her cough med and/or pred?

## 2011-04-04 NOTE — Telephone Encounter (Signed)
Called in RF of cough med. Notified pt and gave Alicia's instructions. Pt agreed

## 2011-04-04 NOTE — Telephone Encounter (Signed)
Ok to RF her cough med.  Fill zpack.  If no improvement, have her RTC to see me.

## 2011-04-05 ENCOUNTER — Telehealth: Payer: Self-pay

## 2011-04-05 DIAGNOSIS — R05 Cough: Secondary | ICD-10-CM

## 2011-04-05 MED ORDER — HYDROCOD POLST-CHLORPHEN POLST 10-8 MG/5ML PO LQCR: 5.0000 mL | Freq: Two times a day (BID) | ORAL | Status: DC | PRN

## 2011-04-05 MED ORDER — HYDROCOD POLST-CHLORPHEN POLST 10-8 MG/5ML PO LQCR: 5.0000 mL | Freq: Two times a day (BID) | ORAL | Status: DC

## 2011-04-05 NOTE — Telephone Encounter (Signed)
Called in RX 

## 2011-09-06 ENCOUNTER — Encounter (INDEPENDENT_AMBULATORY_CARE_PROVIDER_SITE_OTHER): Payer: Self-pay | Admitting: General Surgery

## 2011-09-12 ENCOUNTER — Ambulatory Visit (INDEPENDENT_AMBULATORY_CARE_PROVIDER_SITE_OTHER): Payer: 59 | Admitting: General Surgery

## 2011-09-12 ENCOUNTER — Encounter (INDEPENDENT_AMBULATORY_CARE_PROVIDER_SITE_OTHER): Payer: Self-pay | Admitting: General Surgery

## 2011-09-12 VITALS — BP 110/82 | HR 72 | Temp 97.1°F | Resp 14 | Ht 63.0 in | Wt 154.0 lb

## 2011-09-12 DIAGNOSIS — K648 Other hemorrhoids: Secondary | ICD-10-CM

## 2011-09-12 NOTE — Patient Instructions (Signed)
Use miralax everyday to avoid constipation

## 2011-09-12 NOTE — Progress Notes (Signed)
Subjective:     Patient ID: Judith [name], female   DOB: Nov 21, 1975, 36 y.o.   MRN: [id number]  HPI The patient is a 30 6-0 black female who we saw a couple years ago with some hemorrhoid issues. She was unable to schedule surgery at that time. Since then she has continued to have episodes of rectal pain and bleeding with her bowel movements. One flares up it lasts about 2 weeks. Her episodes are occurring about 2-3 weeks apart.  Review of Systems  Constitutional: Negative.   HENT: Negative.   Eyes: Negative.   Respiratory: Negative.   Cardiovascular: Negative.   Gastrointestinal: Positive for constipation, anal bleeding and rectal pain.  Genitourinary: Negative.   Musculoskeletal: Negative.   Skin: Negative.   Neurological: Negative.   Hematological: Negative.   Psychiatric/Behavioral: Negative.        Objective:   Physical Exam  Constitutional: She is oriented to person, place, and time. She appears well-developed and well-nourished.  HENT:  Head: Normocephalic and atraumatic.  Eyes: Conjunctivae and EOM are normal. Pupils are equal, round, and reactive to light.  Neck: Normal range of motion. Neck supple.  Cardiovascular: Normal rate, regular rhythm and normal heart sounds.   Pulmonary/Chest: Effort normal and breath sounds normal.  Abdominal: Soft. Bowel sounds are normal.  Genitourinary:       On rectal exam she has some very small soft external hemorrhoidal skin tags in front and back. On digital exam she has good rectal tone and no palpable mass other than what feels like a small thrombosed internal hemorrhoid posteriorly. On anoscopic exam I did not see any significant internal hemorrhoidal tissue or thrombosis.  Neurological: She is alert and oriented to person, place, and time.  Skin: Skin is warm and dry.  Psychiatric: She has a normal mood and affect. Her behavior is normal.       Assessment:     At this point on physical exam I do not see anything that I  think is large enough that would warrant surgical excision. Because she continues to have pain nothing to be reasonable to take her to the operating room and do a better exam under anesthesia and if we saw any significant internal tissue we could try to band it. I've discussed this with her in detail including the risks and benefits of surgery as well as some technical aspects and she understands and wishes to proceed.    Plan:   Plan for exam under anesthesia and possible internal hemorrhoidal banding.

## 2011-09-20 ENCOUNTER — Telehealth (INDEPENDENT_AMBULATORY_CARE_PROVIDER_SITE_OTHER): Payer: Self-pay | Admitting: General Surgery

## 2011-09-20 NOTE — Telephone Encounter (Signed)
LMOM asking pt to return my call. °

## 2011-09-21 NOTE — Telephone Encounter (Signed)
Called patient again and gave her the PO appt information.  Explained that it will be on 8/30 at 2:50.  She was fine with this.  I also sent a reminder card in the mail.

## 2011-09-23 ENCOUNTER — Other Ambulatory Visit (INDEPENDENT_AMBULATORY_CARE_PROVIDER_SITE_OTHER): Payer: Self-pay | Admitting: General Surgery

## 2011-09-23 DIAGNOSIS — D128 Benign neoplasm of rectum: Secondary | ICD-10-CM

## 2011-09-23 DIAGNOSIS — K648 Other hemorrhoids: Secondary | ICD-10-CM

## 2011-09-23 DIAGNOSIS — D129 Benign neoplasm of anus and anal canal: Secondary | ICD-10-CM

## 2011-09-26 ENCOUNTER — Telehealth (INDEPENDENT_AMBULATORY_CARE_PROVIDER_SITE_OTHER): Payer: Self-pay | Admitting: General Surgery

## 2011-09-26 NOTE — Telephone Encounter (Signed)
LMOM asking pt to return my call. °

## 2011-09-27 ENCOUNTER — Telehealth (INDEPENDENT_AMBULATORY_CARE_PROVIDER_SITE_OTHER): Payer: Self-pay | Admitting: General Surgery

## 2011-09-27 NOTE — Telephone Encounter (Signed)
Spoke with pt to let her know that her surgical pathology came back negative.  She was please with this however she says that within the past two days her external hemorrhoids have become inflamed and are causing her pain.  She has some left over lidocaine cream and wanted to know if she could use that to help alleviate the discomfort.  Advised pt that she could use the cream as well as take ibuprofen to help reduce some of the swelling.  Also informed the pt that she could sit in the bath tub.

## 2011-10-14 ENCOUNTER — Encounter (INDEPENDENT_AMBULATORY_CARE_PROVIDER_SITE_OTHER): Payer: 59 | Admitting: General Surgery

## 2011-10-26 ENCOUNTER — Encounter (INDEPENDENT_AMBULATORY_CARE_PROVIDER_SITE_OTHER): Payer: 59 | Admitting: General Surgery

## 2011-10-31 ENCOUNTER — Telehealth (INDEPENDENT_AMBULATORY_CARE_PROVIDER_SITE_OTHER): Payer: Self-pay | Admitting: General Surgery

## 2011-10-31 NOTE — Telephone Encounter (Signed)
Spoke with pt and informed her that I needed to move her appt on 9/23 from 2:30 to 12:15.  She was fine with this.

## 2011-11-07 ENCOUNTER — Encounter (INDEPENDENT_AMBULATORY_CARE_PROVIDER_SITE_OTHER): Payer: 59 | Admitting: General Surgery

## 2011-11-07 ENCOUNTER — Ambulatory Visit (INDEPENDENT_AMBULATORY_CARE_PROVIDER_SITE_OTHER): Payer: 59 | Admitting: General Surgery

## 2011-11-07 ENCOUNTER — Encounter (INDEPENDENT_AMBULATORY_CARE_PROVIDER_SITE_OTHER): Payer: Self-pay | Admitting: General Surgery

## 2011-11-07 VITALS — BP 124/86 | HR 84 | Temp 98.4°F | Resp 16 | Ht 63.0 in | Wt 161.6 lb

## 2011-11-07 DIAGNOSIS — K648 Other hemorrhoids: Secondary | ICD-10-CM

## 2011-11-07 NOTE — Patient Instructions (Signed)
Use miralax everyday

## 2011-11-07 NOTE — Progress Notes (Signed)
Subjective:     Patient ID: Judith [name], female   DOB: 08-23-1975, 36 y.o.   MRN: [id number]  HPI The patient is a 36 year old black female who is about one month status post internal hemorrhoid banding x3 and rectal biopsy for a benign polyp. Her pain is gradually improving. At this point she is just having pain with her bowel movement. She also has noticed a little bit of bleeding with her bowel movements. Her bowels are moving about 3 times a week.  Review of Systems     Objective:   Physical Exam On exam her perirectal skin looks good. She has good rectal tone. I cannot palpate any masses.    Assessment:     One month status post internal hemorrhoid banding and rectal biopsy    Plan:     At this point I think she will continue to improve. I would like her to use MiraLAX every day to keep her bowel movements softer. We will plan to see her back in one month to check her progress.

## 2011-12-12 ENCOUNTER — Encounter (INDEPENDENT_AMBULATORY_CARE_PROVIDER_SITE_OTHER): Payer: 59 | Admitting: General Surgery

## 2011-12-27 ENCOUNTER — Encounter (INDEPENDENT_AMBULATORY_CARE_PROVIDER_SITE_OTHER): Payer: Self-pay | Admitting: General Surgery

## 2013-01-15 ENCOUNTER — Ambulatory Visit (HOSPITAL_BASED_OUTPATIENT_CLINIC_OR_DEPARTMENT_OTHER): Payer: 59 | Attending: Otolaryngology

## 2014-04-01 ENCOUNTER — Encounter: Payer: Self-pay | Admitting: Neurology

## 2014-04-01 ENCOUNTER — Ambulatory Visit (INDEPENDENT_AMBULATORY_CARE_PROVIDER_SITE_OTHER): Payer: 59 | Admitting: Neurology

## 2014-04-01 VITALS — BP 161/92 | HR 88 | Ht 63.0 in | Wt 168.0 lb

## 2014-04-01 DIAGNOSIS — G4484 Primary exertional headache: Secondary | ICD-10-CM

## 2014-04-01 DIAGNOSIS — H538 Other visual disturbances: Secondary | ICD-10-CM

## 2014-04-01 DIAGNOSIS — R519 Headache, unspecified: Secondary | ICD-10-CM | POA: Insufficient documentation

## 2014-04-01 DIAGNOSIS — R51 Headache: Secondary | ICD-10-CM

## 2014-04-01 MED ORDER — PROPRANOLOL HCL ER 80 MG PO CP24: 80.0000 mg | ORAL_CAPSULE | Freq: Every day | ORAL | Status: DC

## 2014-04-01 MED ORDER — RIZATRIPTAN BENZOATE 5 MG PO TBDP
5.0000 mg | ORAL_TABLET | ORAL | Status: DC | PRN
Start: 2014-04-01 — End: 2014-05-08
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
## 2014-04-21 ENCOUNTER — Telehealth: Payer: Self-pay | Admitting: Neurology

## 2014-04-21 ENCOUNTER — Ambulatory Visit
Admission: RE | Admit: 2014-04-21 | Discharge: 2014-04-21 | Disposition: A | Discharge status: Home / Self Care | Payer: 59 | Source: Ambulatory Visit | Attending: Neurology | Admitting: Neurology

## 2014-04-21 DIAGNOSIS — G4484 Primary exertional headache: Secondary | ICD-10-CM

## 2014-04-21 DIAGNOSIS — H538 Other visual disturbances: Secondary | ICD-10-CM

## 2014-04-21 NOTE — Telephone Encounter (Signed)
MRI approved.

## 2014-04-21 NOTE — Telephone Encounter (Signed)
I have called peer to peer review, she has exertional headache   No. [phone]

## 2014-04-22 ENCOUNTER — Telehealth: Payer: Self-pay | Admitting: Neurology

## 2014-04-22 NOTE — Telephone Encounter (Signed)
Pt aware of results and will keep her appt at the end of this month.

## 2014-04-22 NOTE — Telephone Encounter (Signed)
Michelle: Please call patient, no significant abnormality on MRI of the brain  Equivocal MRI brain (without) demonstrating: 1. Few punctate foci of subcortical and juxtacortical non-specific gliosis. 2. No acute findings. 3. No significant change from MRI on 05/10/07.

## 2014-04-22 NOTE — Telephone Encounter (Signed)
Pt aware of results 

## 2014-05-08 ENCOUNTER — Encounter: Payer: Self-pay | Admitting: Neurology

## 2014-05-08 ENCOUNTER — Ambulatory Visit (INDEPENDENT_AMBULATORY_CARE_PROVIDER_SITE_OTHER): Payer: 59 | Admitting: Neurology

## 2014-05-08 VITALS — BP 119/84 | HR 92 | Ht 63.0 in | Wt 181.0 lb

## 2014-05-08 DIAGNOSIS — G4484 Primary exertional headache: Secondary | ICD-10-CM | POA: Diagnosis not present

## 2014-05-08 MED ORDER — SUMATRIPTAN SUCCINATE 100 MG PO TABS: 100.0000 mg | ORAL_TABLET | ORAL | Status: DC | PRN
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
## 2014-08-12 ENCOUNTER — Ambulatory Visit: Payer: 59 | Admitting: Neurology

## 2014-08-12 ENCOUNTER — Telehealth: Payer: Self-pay | Admitting: *Deleted

## 2014-08-12 NOTE — Telephone Encounter (Signed)
No showed follow up appt. 

## 2014-08-13 ENCOUNTER — Encounter: Payer: Self-pay | Admitting: Neurology

## 2015-01-06 ENCOUNTER — Encounter (HOSPITAL_BASED_OUTPATIENT_CLINIC_OR_DEPARTMENT_OTHER): Payer: Self-pay | Admitting: *Deleted

## 2015-01-06 ENCOUNTER — Emergency Department (HOSPITAL_BASED_OUTPATIENT_CLINIC_OR_DEPARTMENT_OTHER)
Admission: EM | Admit: 2015-01-06 | Discharge: 2015-01-06 | Disposition: A | Discharge status: Home / Self Care | Payer: Commercial Managed Care - HMO | Attending: Emergency Medicine | Admitting: Emergency Medicine

## 2015-01-06 DIAGNOSIS — R112 Nausea with vomiting, unspecified: Secondary | ICD-10-CM

## 2015-01-06 DIAGNOSIS — K529 Noninfective gastroenteritis and colitis, unspecified: Secondary | ICD-10-CM | POA: Diagnosis not present

## 2015-01-06 DIAGNOSIS — F17210 Nicotine dependence, cigarettes, uncomplicated: Secondary | ICD-10-CM | POA: Diagnosis not present

## 2015-01-06 DIAGNOSIS — R197 Diarrhea, unspecified: Secondary | ICD-10-CM

## 2015-01-06 DIAGNOSIS — Z8659 Personal history of other mental and behavioral disorders: Secondary | ICD-10-CM | POA: Diagnosis not present

## 2015-01-06 DIAGNOSIS — R109 Unspecified abdominal pain: Secondary | ICD-10-CM | POA: Diagnosis present

## 2015-01-06 LAB — COMPREHENSIVE METABOLIC PANEL
ALBUMIN: 4.1 g/dL (ref 3.5–5.0)
ALK PHOS: 76 U/L (ref 38–126)
ALT: 20 U/L (ref 14–54)
ANION GAP: 10 (ref 5–15)
AST: 22 U/L (ref 15–41)
BUN: 11 mg/dL (ref 6–20)
CHLORIDE: 99 mmol/L — AB (ref 101–111)
CO2: 27 mmol/L (ref 22–32)
Calcium: 9.7 mg/dL (ref 8.9–10.3)
Creatinine, Ser: 0.67 mg/dL (ref 0.44–1.00)
GFR calc non Af Amer: >60 mL/min (ref >60)
GLUCOSE: 95 mg/dL (ref 65–99)
Potassium: 3.4 mmol/L — ABNORMAL LOW (ref 3.5–5.1)
SODIUM: 136 mmol/L (ref 135–145)
Total Bilirubin: 0.6 mg/dL (ref 0.3–1.2)
Total Protein: 7.6 g/dL (ref 6.5–8.1)

## 2015-01-06 LAB — LIPASE, BLOOD: Lipase: 20 U/L (ref 11–51)

## 2015-01-06 LAB — URINALYSIS, ROUTINE W REFLEX MICROSCOPIC
Glucose, UA: NEGATIVE mg/dL
Hgb urine dipstick: NEGATIVE
Ketones, ur: 15 mg/dL — AB
NITRITE: NEGATIVE
PH: 5.5 (ref 5.0–8.0)
Protein, ur: NEGATIVE mg/dL
SPECIFIC GRAVITY, URINE: 1.018 (ref 1.005–1.030)

## 2015-01-06 LAB — CBC
HCT: 40.4 % (ref 36.0–46.0)
HEMOGLOBIN: 13.9 g/dL (ref 12.0–15.0)
MCH: 28.8 pg (ref 26.0–34.0)
MCHC: 34.4 g/dL (ref 30.0–36.0)
MCV: 83.8 fL (ref 78.0–100.0)
PLATELETS: 225 10*3/uL (ref 150–400)
RBC: 4.82 MIL/uL (ref 3.87–5.11)
RDW: 13.2 % (ref 11.5–15.5)
WBC: 5.2 10*3/uL (ref 4.0–10.5)

## 2015-01-06 LAB — URINE MICROSCOPIC-ADD ON

## 2015-01-06 MED ORDER — FAMOTIDINE 20 MG PO TABS
20.0000 mg | ORAL_TABLET | Freq: Once | ORAL | Status: AC
  Administered 2015-01-06: 20 mg via ORAL
  Filled 2015-01-06: qty 1

## 2015-01-06 MED ORDER — ONDANSETRON 8 MG PO TBDP
8.0000 mg | ORAL_TABLET | Freq: Three times a day (TID) | ORAL | Status: DC | PRN
Start: 2015-01-06 — End: 2015-11-24

## 2015-01-06 MED ORDER — ONDANSETRON HCL 4 MG/2ML IJ SOLN
4.0000 mg | Freq: Once | INTRAMUSCULAR | Status: AC
  Administered 2015-01-06: 4 mg via INTRAVENOUS
  Filled 2015-01-06: qty 2

## 2015-01-06 MED ORDER — CIPROFLOXACIN HCL 500 MG PO TABS: 500.0000 mg | ORAL_TABLET | Freq: Two times a day (BID) | ORAL | Status: DC

## 2015-01-06 MED ORDER — TRAMADOL HCL 50 MG PO TABS
50.0000 mg | ORAL_TABLET | Freq: Once | ORAL | Status: AC
  Administered 2015-01-06: 50 mg via ORAL
  Filled 2015-01-06: qty 1

## 2015-01-06 MED ORDER — ACETAMINOPHEN 325 MG PO TABS
650.0000 mg | ORAL_TABLET | Freq: Once | ORAL | Status: AC
  Administered 2015-01-06: 650 mg via ORAL
  Filled 2015-01-06: qty 2

## 2015-01-06 MED ORDER — TRAMADOL HCL 50 MG PO TABS: 50.0000 mg | ORAL_TABLET | Freq: Four times a day (QID) | ORAL | Status: DC | PRN

## 2015-01-06 MED ORDER — GI COCKTAIL ~~LOC~~
30.0000 mL | Freq: Once | ORAL | Status: AC
  Administered 2015-01-06: 30 mL via ORAL
  Filled 2015-01-06: qty 30

## 2015-01-06 MED ORDER — CIPROFLOXACIN HCL 500 MG PO TABS
500.0000 mg | ORAL_TABLET | Freq: Once | ORAL | Status: AC
  Administered 2015-01-06: 500 mg via ORAL
  Filled 2015-01-06: qty 1

## 2015-01-06 MED ORDER — SODIUM CHLORIDE 0.9 % IV BOLUS (SEPSIS)
1000.0000 mL | Freq: Once | INTRAVENOUS | Status: AC
  Administered 2015-01-06: 1000 mL via INTRAVENOUS

## 2015-01-06 MED ORDER — IBUPROFEN 200 MG PO TABS
200.0000 mg | ORAL_TABLET | Freq: Once | ORAL | Status: AC
  Administered 2015-01-06: 200 mg via ORAL
  Filled 2015-01-06: qty 1

## 2015-01-06 NOTE — ED Notes (Signed)
C/o h/a and dizziness yesterday am also c/o abd pain from epigastric area to low mid abd pain. N/V/D with chiills. No problems with urinating.

## 2015-01-06 NOTE — ED Notes (Signed)
Patient able to drink and hold fluids.

## 2015-01-06 NOTE — Discharge Instructions (Signed)
It was our pleasure to provide your ER care today - we hope that you feel better.  Rest. Drink plenty of fluids.  You may take zofran as need if nauseated.  You may take ultram as need for pain - no driving for the next 4 hours, or when taking ultram.  Your urine tests show a possible urine infection - take antibiotic (cipro) as prescribed.  From today's labs, your potassium level is slightly low (3.4) - eat plenty of fruits and vegetables, and follow up with primary care doctor.  Follow up with primary care doctor, or here, tomorrow morning for recheck if symptoms fail to improve/resolve.  Return to ER if worse, new symptoms, fevers,  persistent or worsening abdominal pain, persistent vomiting, other concern.     Viral Gastroenteritis Viral gastroenteritis is also known as stomach flu. This condition affects the stomach and intestinal tract. It can cause sudden diarrhea and vomiting. The illness typically lasts 3 to 8 days. Most people develop an immune response that eventually gets rid of the virus. While this natural response develops, the virus can make you quite ill. CAUSES  Many different viruses can cause gastroenteritis, such as rotavirus or noroviruses. You can catch one of these viruses by consuming contaminated food or water. You may also catch a virus by sharing utensils or other personal items with an infected person or by touching a contaminated surface. SYMPTOMS  The most common symptoms are diarrhea and vomiting. These problems can cause a severe loss of body fluids (dehydration) and a body salt (electrolyte) imbalance. Other symptoms may include:  Fever.  Headache.  Fatigue.  Abdominal pain. DIAGNOSIS  Your caregiver can usually diagnose viral gastroenteritis based on your symptoms and a physical exam. A stool sample may also be taken to test for the presence of viruses or other infections. TREATMENT  This illness typically goes away on its own. Treatments are  aimed at rehydration. The most serious cases of viral gastroenteritis involve vomiting so severely that you are not able to keep fluids down. In these cases, fluids must be given through an intravenous line (IV). HOME CARE INSTRUCTIONS   Drink enough fluids to keep your urine clear or pale yellow. Drink small amounts of fluids frequently and increase the amounts as tolerated.  Ask your caregiver for specific rehydration instructions.  Avoid:  Foods high in sugar.  Alcohol.  Carbonated drinks.  Tobacco.  Juice.  Caffeine drinks.  Extremely hot or cold fluids.  Fatty, greasy foods.  Too much intake of anything at one time.  Dairy products until 24 to 48 hours after diarrhea stops.  You may consume probiotics. Probiotics are active cultures of beneficial bacteria. They may lessen the amount and number of diarrheal stools in adults. Probiotics can be found in yogurt with active cultures and in supplements.  Wash your hands well to avoid spreading the virus.  Only take over-the-counter or prescription medicines for pain, discomfort, or fever as directed by your caregiver. Do not give aspirin to children. Antidiarrheal medicines are not recommended.  Ask your caregiver if you should continue to take your regular prescribed and over-the-counter medicines.  Keep all follow-up appointments as directed by your caregiver. SEEK IMMEDIATE MEDICAL CARE IF:   You are unable to keep fluids down.  You do not urinate at least once every 6 to 8 hours.  You develop shortness of breath.  You notice blood in your stool or vomit. This may look like coffee grounds.  You have abdominal  pain that increases or is concentrated in one small area (localized).  You have persistent vomiting or diarrhea.  You have a fever.  The patient is a child younger than 3 months, and he or she has a fever.  The patient is a child older than 3 months, and he or she has a fever and persistent  symptoms.  The patient is a child older than 3 months, and he or she has a fever and symptoms suddenly get worse.  The patient is a baby, and he or she has no tears when crying. MAKE SURE YOU:   Understand these instructions.  Will watch your condition.  Will get help right away if you are not doing well or get worse.   This information is not intended to replace advice given to you by your health care provider. Make sure you discuss any questions you have with your health care provider.   Document Released: 01/31/2005 Document Revised: 04/25/2011 Document Reviewed: 11/17/2010 Elsevier Interactive Patient Education 2016 [employer].   Hypokalemia Hypokalemia means that the amount of potassium in the blood is lower than normal.Potassium is a chemical, called an electrolyte, that helps regulate the amount of fluid in the body. It also stimulates muscle contraction and helps nerves function properly.Most of the body's potassium is inside of cells, and only a very small amount is in the blood. Because the amount in the blood is so small, minor changes can be life-threatening. CAUSES  Antibiotics.  Diarrhea or vomiting.  Using laxatives too much, which can cause diarrhea.  Chronic kidney disease.  Water pills (diuretics).  Eating disorders (bulimia).  Low magnesium level.  Sweating a lot. SIGNS AND SYMPTOMS  Weakness.  Constipation.  Fatigue.  Muscle cramps.  Mental confusion.  Skipped heartbeats or irregular heartbeat (palpitations).  Tingling or numbness. DIAGNOSIS  Your health care provider can diagnose hypokalemia with blood tests. In addition to checking your potassium level, your health care provider may also check other lab tests. TREATMENT Hypokalemia can be treated with potassium supplements taken by mouth or adjustments in your current medicines. If your potassium level is very low, you may need to get potassium through a vein (IV) and be monitored in  the hospital. A diet high in potassium is also helpful. Foods high in potassium are:  Nuts, such as peanuts and pistachios.  Seeds, such as sunflower seeds and pumpkin seeds.  Peas, lentils, and lima beans.  Whole grain and bran cereals and breads.  Fresh fruit and vegetables, such as apricots, avocado, bananas, cantaloupe, kiwi, oranges, tomatoes, asparagus, and potatoes.  Orange and tomato juices.  Red meats.  Fruit yogurt. HOME CARE INSTRUCTIONS  Take all medicines as prescribed by your health care provider.  Maintain a healthy diet by including nutritious food, such as fruits, vegetables, nuts, whole grains, and lean meats.  If you are taking a laxative, be sure to follow the directions on the label. SEEK MEDICAL CARE IF:  Your weakness gets worse.  You feel your heart pounding or racing.  You are vomiting or having diarrhea.  You are diabetic and having trouble keeping your blood glucose in the normal range. SEEK IMMEDIATE MEDICAL CARE IF:  You have chest pain, shortness of breath, or dizziness.  You are vomiting or having diarrhea for more than 2 days.  You faint. MAKE SURE YOU:   Understand these instructions.  Will watch your condition.  Will get help right away if you are not doing well or get worse.  This information is not intended to replace advice given to you by your health care provider. Make sure you discuss any questions you have with your health care provider.   Document Released: 01/31/2005 Document Revised: 02/21/2014 Document Reviewed: 08/03/2012 Elsevier Interactive Patient Education 2016 [employer]    Abdominal Pain, Adult Many things can cause abdominal pain. Usually, abdominal pain is not caused by a disease and will improve without treatment. It can often be observed and treated at home. Your health care provider will do a physical exam and possibly order blood tests and X-rays to help determine the seriousness of your pain.  However, in many cases, more time must pass before a clear cause of the pain can be found. Before that point, your health care provider may not know if you need more testing or further treatment. HOME CARE INSTRUCTIONS Monitor your abdominal pain for any changes. The following actions may help to alleviate any discomfort you are experiencing:  Only take over-the-counter or prescription medicines as directed by your health care provider.  Do not take laxatives unless directed to do so by your health care provider.  Try a clear liquid diet (broth, tea, or water) as directed by your health care provider. Slowly move to a bland diet as tolerated. SEEK MEDICAL CARE IF:  You have unexplained abdominal pain.  You have abdominal pain associated with nausea or diarrhea.  You have pain when you urinate or have a bowel movement.  You experience abdominal pain that wakes you in the night.  You have abdominal pain that is worsened or improved by eating food.  You have abdominal pain that is worsened with eating fatty foods.  You have a fever. SEEK IMMEDIATE MEDICAL CARE IF:  Your pain does not go away within 2 hours.  You keep throwing up (vomiting).  Your pain is felt only in portions of the abdomen, such as the right side or the left lower portion of the abdomen.  You pass bloody or black tarry stools. MAKE SURE YOU:  Understand these instructions.  Will watch your condition.  Will get help right away if you are not doing well or get worse.   This information is not intended to replace advice given to you by your health care provider. Make sure you discuss any questions you have with your health care provider.   Document Released: 11/10/2004 Document Revised: 10/22/2014 Document Reviewed: 10/10/2012 Elsevier Interactive Patient Education [employer].

## 2015-01-06 NOTE — ED Notes (Signed)
Ginger ale given per request

## 2015-01-06 NOTE — ED Notes (Signed)
MD at bedside. 

## 2015-01-06 NOTE — ED Notes (Signed)
Pt tolerated gingerale without getting nauseated.

## 2015-01-06 NOTE — ED Provider Notes (Addendum)
CSN: [id number]     Arrival date & time 01/06/15  [id number] History   First MD Initiated Contact with Patient 01/06/15 0902     Chief Complaint  Patient presents with  . Abdominal Pain     (Consider location/radiation/quality/duration/timing/severity/associated sxs/prior Treatment) The history is provided by the patient.  Patient c/o stomach pain/cramping, nausea, couple episodes emesis (not bloody or bilious) and more recently dry heaves, approximately 4-5 loose to watery, non bloody bms, onset yesterday. abd pain cramping, sudden, mod-sev, but episodes typically last 10-15 seconds. Pain diffuse, not one specific location. No back or flank pain. No dysuria or hematuria. No vaginal bleeding or discharge. Hx hysterectomy.  No other abd surgery.  No hx pancreatitis, pud or gallstones. No known bad food ingestion or ill contact. No new meds or abx. No fever or chills. No cough, sinus congestion or uri c/o.       Past Medical History  Diagnosis Date  . Allergy   . Anxiety   . External hemorrhoid   . [id number]    Past Surgical History  Procedure Laterality Date  . Vaginal delivery  1996, 1998, 2002  . Partial hysterectomy  2012  . Breast enhancement surgery  2009   Family History  Problem Relation Age of Onset  . Healthy Mother   . Healthy Father    Social History  Substance Use Topics  . Smoking status: Current Some Day Smoker -- 0.50 packs/day    Types: Cigarettes  . Smokeless tobacco: None  . Alcohol Use: 0.0 oz/week    0 Standard drinks or equivalent per week     Comment: occasional use   OB History    No data available     Review of Systems  Constitutional: Negative for fever and chills.  HENT: Negative for sore throat.   Eyes: Negative for redness.  Respiratory: Negative for cough and shortness of breath.   Cardiovascular: Negative for chest pain.  Gastrointestinal: Positive for nausea, vomiting and diarrhea. Negative for abdominal pain.  Endocrine: Negative  for polyuria.  Genitourinary: Negative for dysuria and flank pain.  Musculoskeletal: Negative for back pain and neck pain.  Skin: Negative for rash.  Neurological: Negative for headaches.  Hematological: Does not bruise/bleed easily.  Psychiatric/Behavioral: Negative for confusion.      Allergies  Review of patient's allergies indicates no known allergies.  Home Medications   Prior to Admission medications   Not on File   BP 119/88 mmHg  Pulse 116  Temp(Src) 98.2 F (36.8 C) (Oral)  Resp 18  Ht 5\' 3"  (1.6 m)  Wt 74.844 kg  BMI 29.24 kg/m2  SpO2 98% Physical Exam  Constitutional: She appears well-developed and well-nourished. No distress.  HENT:  Mouth/Throat: Oropharynx is clear and moist.  Eyes: Conjunctivae are normal. No scleral icterus.  Neck: Neck supple. No tracheal deviation present.  Cardiovascular: Normal rate, regular rhythm, normal heart sounds and intact distal pulses.  Exam reveals no gallop and no friction rub.   No murmur heard. Pulmonary/Chest: Effort normal and breath sounds normal. No respiratory distress.  Abdominal: Soft. Normal appearance and bowel sounds are normal. She exhibits no distension and no mass. There is no tenderness. There is no rebound and no guarding.  No incarcerated hernia.   Genitourinary:  No cva/flank tenderness  Musculoskeletal: She exhibits no edema.  Neurological: She is alert.  Skin: Skin is warm and dry. No rash noted. She is not diaphoretic.  Psychiatric: She has a normal mood and affect.  Nursing note and vitals reviewed.   ED Course  Procedures (including critical care time) Labs Review   Results for orders placed or performed during the hospital encounter of 01/06/15  CBC  Result Value Ref Range   WBC 5.2 4.0 - 10.5 K/uL   RBC 4.82 3.87 - 5.11 MIL/uL   Hemoglobin 13.9 12.0 - 15.0 g/dL   HCT 40.4 36.0 - 46.0 %   MCV 83.8 78.0 - 100.0 fL   MCH 28.8 26.0 - 34.0 pg   MCHC 34.4 30.0 - 36.0 g/dL   RDW 13.2 11.5  - 15.5 %   Platelets 225 150 - 400 K/uL  Comprehensive metabolic panel  Result Value Ref Range   Sodium 136 135 - 145 mmol/L   Potassium 3.4 (L) 3.5 - 5.1 mmol/L   Chloride 99 (L) 101 - 111 mmol/L   CO2 27 22 - 32 mmol/L   Glucose, Bld 95 65 - 99 mg/dL   BUN 11 6 - 20 mg/dL   Creatinine, Ser 0.67 0.44 - 1.00 mg/dL   Calcium 9.7 8.9 - 10.3 mg/dL   Total Protein 7.6 6.5 - 8.1 g/dL   Albumin 4.1 3.5 - 5.0 g/dL   AST 22 15 - 41 U/L   ALT 20 14 - 54 U/L   Alkaline Phosphatase 76 38 - 126 U/L   Total Bilirubin 0.6 0.3 - 1.2 mg/dL   GFR calc non Af Amer >60 >60 mL/min   GFR calc Af Amer >60 >60 mL/min   Anion gap 10 5 - 15  Lipase, blood  Result Value Ref Range   Lipase 20 11 - 51 U/L  Urinalysis, Routine w reflex microscopic (not at [facility])  Result Value Ref Range   Color, Urine YELLOW YELLOW   APPearance CLOUDY (A) CLEAR   Specific Gravity, Urine 1.018 1.005 - 1.030   pH 5.5 5.0 - 8.0   Glucose, UA NEGATIVE NEGATIVE mg/dL   Hgb urine dipstick NEGATIVE NEGATIVE   Bilirubin Urine SMALL (A) NEGATIVE   Ketones, ur 15 (A) NEGATIVE mg/dL   Protein, ur NEGATIVE NEGATIVE mg/dL   Nitrite NEGATIVE NEGATIVE   Leukocytes, UA MODERATE (A) NEGATIVE  Urine microscopic-add on  Result Value Ref Range   Squamous Epithelial / LPF 0-5 (A) NONE SEEN   WBC, UA 0-5 0 - 5 WBC/hpf   RBC / HPF 0-5 0 - 5 RBC/hpf   Bacteria, UA MANY (A) NONE SEEN   Urine-Other MUCOUS PRESENT       I have personally reviewed and evaluated these images and lab results as part of my medical decision-making.    MDM   Iv ns. Labs.  Reviewed nursing notes and prior charts for additional history.   Iv ns bolus.  Pt drove self to ed.   Recheck, abd soft nt. No emesis.   Pt with many bact, LE positive on UA.  No cva tenderness. No fever. Will rx for possible uti.  Last pos u cx, sens cipr, not sens to keflex. Will give rx cipro.   Tolerating po fluids.  abd remains soft nt on exam.  Pt denies any constant pain.  States occasionally will get 10 second spasm of pain.  abd soft nt.   Gi meds for symptom relief. Tylenol po.  Pt requests pain med in ED, indicates she will get family member to come pick her up.  Ultram po.   Pt tolerates. No nv. Pt appears stable for d/c.   Discussed recheck tomorrow AM if  symptoms fail to improve/resolve.  Return precautions provided.      Lajean Saver, MD 01/06/15 1226

## 2015-06-17 ENCOUNTER — Emergency Department (HOSPITAL_COMMUNITY)
Admission: EM | Admit: 2015-06-17 | Discharge: 2015-06-17 | Disposition: A | Discharge status: Home / Self Care | Payer: Worker's Compensation | Attending: Emergency Medicine | Admitting: Emergency Medicine

## 2015-06-17 ENCOUNTER — Encounter (HOSPITAL_COMMUNITY): Payer: Self-pay | Admitting: Emergency Medicine

## 2015-06-17 ENCOUNTER — Emergency Department (HOSPITAL_COMMUNITY): Payer: Worker's Compensation

## 2015-06-17 DIAGNOSIS — S199XXA Unspecified injury of neck, initial encounter: Secondary | ICD-10-CM | POA: Diagnosis not present

## 2015-06-17 DIAGNOSIS — Z23 Encounter for immunization: Secondary | ICD-10-CM | POA: Diagnosis not present

## 2015-06-17 DIAGNOSIS — Y9389 Activity, other specified: Secondary | ICD-10-CM | POA: Diagnosis not present

## 2015-06-17 DIAGNOSIS — Z8719 Personal history of other diseases of the digestive system: Secondary | ICD-10-CM | POA: Insufficient documentation

## 2015-06-17 DIAGNOSIS — Z792 Long term (current) use of antibiotics: Secondary | ICD-10-CM | POA: Diagnosis not present

## 2015-06-17 DIAGNOSIS — F419 Anxiety disorder, unspecified: Secondary | ICD-10-CM | POA: Insufficient documentation

## 2015-06-17 DIAGNOSIS — M62830 Muscle spasm of back: Secondary | ICD-10-CM | POA: Insufficient documentation

## 2015-06-17 DIAGNOSIS — S20412A Abrasion of left back wall of thorax, initial encounter: Secondary | ICD-10-CM | POA: Diagnosis not present

## 2015-06-17 DIAGNOSIS — S50311A Abrasion of right elbow, initial encounter: Secondary | ICD-10-CM | POA: Diagnosis not present

## 2015-06-17 DIAGNOSIS — S80211A Abrasion, right knee, initial encounter: Secondary | ICD-10-CM | POA: Insufficient documentation

## 2015-06-17 DIAGNOSIS — F17210 Nicotine dependence, cigarettes, uncomplicated: Secondary | ICD-10-CM | POA: Diagnosis not present

## 2015-06-17 DIAGNOSIS — S4992XA Unspecified injury of left shoulder and upper arm, initial encounter: Secondary | ICD-10-CM | POA: Insufficient documentation

## 2015-06-17 DIAGNOSIS — Y998 Other external cause status: Secondary | ICD-10-CM | POA: Diagnosis not present

## 2015-06-17 DIAGNOSIS — Y9289 Other specified places as the place of occurrence of the external cause: Secondary | ICD-10-CM | POA: Insufficient documentation

## 2015-06-17 DIAGNOSIS — S8991XA Unspecified injury of right lower leg, initial encounter: Secondary | ICD-10-CM | POA: Diagnosis present

## 2015-06-17 DIAGNOSIS — T07XXXA Unspecified multiple injuries, initial encounter: Secondary | ICD-10-CM

## 2015-06-17 LAB — RAPID HIV SCREEN (HIV 1/2 AB+AG)
HIV 1/2 ANTIBODIES: NONREACTIVE
HIV-1 P24 Antigen - HIV24: NONREACTIVE

## 2015-06-17 LAB — MRSA PCR SCREENING: MRSA by PCR: NEGATIVE

## 2015-06-17 MED ORDER — OXYCODONE-ACETAMINOPHEN 5-325 MG PO TABS
1.0000 | ORAL_TABLET | Freq: Once | ORAL | Status: AC
  Administered 2015-06-17: 1 via ORAL
  Filled 2015-06-17: qty 1

## 2015-06-17 MED ORDER — TETANUS-DIPHTH-ACELL PERTUSSIS 5-2.5-18.5 LF-MCG/0.5 IM SUSP
0.5000 mL | Freq: Once | INTRAMUSCULAR | Status: AC
  Administered 2015-06-17: 0.5 mL via INTRAMUSCULAR
  Filled 2015-06-17: qty 0.5

## 2015-06-17 MED ORDER — OXYCODONE-ACETAMINOPHEN 5-325 MG PO TABS: 1.0000 | ORAL_TABLET | ORAL | Status: DC | PRN
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
## 2015-06-17 NOTE — Discharge Instructions (Signed)
Take the prescribed medication as directed.  Use caution, it can make you sleepy/drowsy.  Do not drive while taking this. You may continue to be sore for the next few days which is normal. Make sure to keep an eye on your abrasions for any signs of infection-- redness, swelling, pus, etc. Follow-up with your primary care physician. Return to the ED for new or worsening symptoms.
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
## 2015-06-18 LAB — HEPATITIS C ANTIBODY (REFLEX): HCV Ab: <0.1 s/co ratio (ref 0.0–0.9)

## 2015-06-18 LAB — HCV COMMENT:

## 2015-06-18 LAB — HEPATITIS B SURFACE ANTIGEN: Hepatitis B Surface Ag: NEGATIVE

## 2015-09-08 ENCOUNTER — Other Ambulatory Visit: Payer: Self-pay | Admitting: Obstetrics and Gynecology

## 2015-09-08 DIAGNOSIS — N644 Mastodynia: Secondary | ICD-10-CM

## 2015-09-10 ENCOUNTER — Ambulatory Visit
Admission: RE | Admit: 2015-09-10 | Discharge: 2015-09-10 | Disposition: A | Discharge status: Home / Self Care | Payer: Commercial Managed Care - HMO | Source: Ambulatory Visit | Attending: Obstetrics and Gynecology | Admitting: Obstetrics and Gynecology

## 2015-09-10 ENCOUNTER — Other Ambulatory Visit: Payer: Self-pay | Admitting: Obstetrics and Gynecology

## 2015-09-10 DIAGNOSIS — N644 Mastodynia: Secondary | ICD-10-CM

## 2015-10-06 ENCOUNTER — Other Ambulatory Visit: Payer: Self-pay | Admitting: Gastroenterology

## 2015-11-24 ENCOUNTER — Encounter (HOSPITAL_BASED_OUTPATIENT_CLINIC_OR_DEPARTMENT_OTHER): Payer: Self-pay | Admitting: *Deleted

## 2015-11-24 ENCOUNTER — Emergency Department (HOSPITAL_BASED_OUTPATIENT_CLINIC_OR_DEPARTMENT_OTHER): Payer: Commercial Managed Care - HMO

## 2015-11-24 ENCOUNTER — Emergency Department (HOSPITAL_BASED_OUTPATIENT_CLINIC_OR_DEPARTMENT_OTHER)
Admission: EM | Admit: 2015-11-24 | Discharge: 2015-11-24 | Disposition: A | Discharge status: Home / Self Care | Payer: Commercial Managed Care - HMO | Attending: Emergency Medicine | Admitting: Emergency Medicine

## 2015-11-24 DIAGNOSIS — Z79899 Other long term (current) drug therapy: Secondary | ICD-10-CM | POA: Diagnosis not present

## 2015-11-24 DIAGNOSIS — R197 Diarrhea, unspecified: Secondary | ICD-10-CM | POA: Insufficient documentation

## 2015-11-24 DIAGNOSIS — R112 Nausea with vomiting, unspecified: Secondary | ICD-10-CM | POA: Diagnosis not present

## 2015-11-24 DIAGNOSIS — R1013 Epigastric pain: Secondary | ICD-10-CM | POA: Diagnosis not present

## 2015-11-24 DIAGNOSIS — F17210 Nicotine dependence, cigarettes, uncomplicated: Secondary | ICD-10-CM | POA: Insufficient documentation

## 2015-11-24 LAB — URINALYSIS, ROUTINE W REFLEX MICROSCOPIC
Bilirubin Urine: NEGATIVE
Glucose, UA: NEGATIVE mg/dL
Hgb urine dipstick: NEGATIVE
Ketones, ur: NEGATIVE mg/dL
LEUKOCYTES UA: NEGATIVE
Nitrite: NEGATIVE
PROTEIN: NEGATIVE mg/dL
Specific Gravity, Urine: 1.016 (ref 1.005–1.030)
pH: 5 (ref 5.0–8.0)

## 2015-11-24 LAB — COMPREHENSIVE METABOLIC PANEL
ALT: 27 U/L (ref 14–54)
AST: 35 U/L (ref 15–41)
Albumin: 4.4 g/dL (ref 3.5–5.0)
Alkaline Phosphatase: 80 U/L (ref 38–126)
Anion gap: 12 (ref 5–15)
BILIRUBIN TOTAL: 0.7 mg/dL (ref 0.3–1.2)
BUN: 15 mg/dL (ref 6–20)
CHLORIDE: 102 mmol/L (ref 101–111)
CO2: 24 mmol/L (ref 22–32)
CREATININE: 0.81 mg/dL (ref 0.44–1.00)
Calcium: 9.5 mg/dL (ref 8.9–10.3)
GFR calc Af Amer: >60 mL/min (ref >60)
Glucose, Bld: 104 mg/dL — ABNORMAL HIGH (ref 65–99)
Potassium: 4 mmol/L (ref 3.5–5.1)
Sodium: 138 mmol/L (ref 135–145)
Total Protein: 8.1 g/dL (ref 6.5–8.1)

## 2015-11-24 LAB — CBC WITH DIFFERENTIAL/PLATELET
BASOS ABS: 0 10*3/uL (ref 0.0–0.1)
Basophils Relative: 0 %
Eosinophils Absolute: 0.1 10*3/uL (ref 0.0–0.7)
Eosinophils Relative: 1 %
HEMATOCRIT: 45.6 % (ref 36.0–46.0)
Hemoglobin: 16 g/dL — ABNORMAL HIGH (ref 12.0–15.0)
LYMPHS PCT: 6 %
Lymphs Abs: 0.4 10*3/uL — ABNORMAL LOW (ref 0.7–4.0)
MCH: 29.3 pg (ref 26.0–34.0)
MCHC: 35.1 g/dL (ref 30.0–36.0)
MCV: 83.4 fL (ref 78.0–100.0)
Monocytes Absolute: 0.4 10*3/uL (ref 0.1–1.0)
Monocytes Relative: 5 %
NEUTROS ABS: 6 10*3/uL (ref 1.7–7.7)
NEUTROS PCT: 88 %
PLATELETS: 266 10*3/uL (ref 150–400)
RBC: 5.47 MIL/uL — AB (ref 3.87–5.11)
RDW: 12.7 % (ref 11.5–15.5)
WBC: 6.8 10*3/uL (ref 4.0–10.5)

## 2015-11-24 LAB — LIPASE, BLOOD: LIPASE: 14 U/L (ref 11–51)

## 2015-11-24 MED ORDER — ONDANSETRON HCL 4 MG/2ML IJ SOLN
INTRAMUSCULAR | Status: AC
  Filled 2015-11-24: qty 2

## 2015-11-24 MED ORDER — SODIUM CHLORIDE 0.9 % IV BOLUS (SEPSIS)
1000.0000 mL | Freq: Once | INTRAVENOUS | Status: AC
  Administered 2015-11-24: 1000 mL via INTRAVENOUS

## 2015-11-24 MED ORDER — DICYCLOMINE HCL 10 MG PO CAPS
ORAL_CAPSULE | ORAL | Status: AC
  Administered 2015-11-24: 20 mg
  Filled 2015-11-24: qty 2

## 2015-11-24 MED ORDER — ONDANSETRON HCL 4 MG/2ML IJ SOLN
4.0000 mg | Freq: Once | INTRAMUSCULAR | Status: AC
  Administered 2015-11-24: 4 mg via INTRAVENOUS
  Filled 2015-11-24: qty 2

## 2015-11-24 MED ORDER — ONDANSETRON HCL 4 MG/2ML IJ SOLN
4.0000 mg | Freq: Once | INTRAMUSCULAR | Status: AC
  Administered 2015-11-24: 4 mg via INTRAVENOUS

## 2015-11-24 MED ORDER — ONDANSETRON 4 MG PO TBDP: 4.0000 mg | ORAL_TABLET | Freq: Three times a day (TID) | ORAL | 0 refills | Status: DC | PRN

## 2015-11-24 MED ORDER — DICYCLOMINE HCL 20 MG PO TABS
20.0000 mg | ORAL_TABLET | Freq: Once | ORAL | Status: DC
  Filled 2015-11-24: qty 1

## 2015-11-24 NOTE — ED Notes (Signed)
Pt given water to drink. MD notified to pt elevated HR

## 2015-11-24 NOTE — Discharge Instructions (Signed)
Follow up with your primary care doctor about your hospital visit. Continue to hydrate orally with small sips of fluids throughout the day. Use Zofran as directed for nausea & vomiting.   The 'BRAT' diet is suggested, then progress to diet as tolerated as symptoms abate.  Bananas.  Rice.  Applesauce.  Toast (and other simple starches such as crackers, potatoes, noodles).   SEEK IMMEDIATE MEDICAL ATTENTION IF: You begin having localized abdominal pain that does not go away or becomes severe A temperature above 101 develops Repeated vomiting occurs (multiple uncontrollable episodes) or you are unable to keep fluids down Blood is being passed in stools or vomit (bright red or black tarry stools).  If you develop chest pain, difficulty breathing, dizziness or fainting, or become confused, poorly responsive, or inconsolable (young children).

## 2015-11-24 NOTE — ED Provider Notes (Signed)
[place] [facility] Provider Note   CSN: [id number] Arrival date & time: 11/24/15  1502     History   Chief Complaint Chief Complaint  Patient presents with  . Abdominal Pain    HPI Judith [name] is a 40 y.o. female.  The history is provided by the patient and medical records. No language interpreter was used.  Abdominal Pain   Associated symptoms include diarrhea, nausea and vomiting. Pertinent negatives include fever, dysuria, frequency and headaches.   Judith [name] is a 40 y.o. female  who presents to the Emergency Department complaining of Upper abdominal pain that began this morning at 8 AM and has progressively worsened throughout the day. She endorses associated nausea, vomiting and nonbloody diarrhea. She states she has had 4-5 episodes of emesis today. No medications taken prior to arrival for symptoms. No known sick contacts. No new foods or recent camping/travel. No fever, back pain, chest pain, shortness of breath, dysuria, vaginal discharge or other associated symptoms.  Past Medical History:  Diagnosis Date  . Allergy   . Anxiety   . External hemorrhoid   . [id number])     Patient Active Problem List   Diagnosis Date Noted  . Headache 04/01/2014  . Vision blurring 04/01/2014  . Internal hemorrhoid 09/12/2011    Past Surgical History:  Procedure Laterality Date  . BREAST ENHANCEMENT SURGERY  2009  . PARTIAL HYSTERECTOMY  2012  . [place], 2002    OB History    No data available       Home Medications    Prior to Admission medications   Medication Sig Start Date End Date Taking? Authorizing Provider  oxyCODONE-acetaminophen (PERCOCET/ROXICET) 5-325 MG tablet Take 1 tablet by mouth every 4 (four) hours as needed. 06/17/15  Yes Larene Pickett, PA-C  ciprofloxacin (CIPRO) 500 MG tablet Take 1 tablet (500 mg total) by mouth 2 (two) times daily. 01/06/15   Lajean Saver, MD  ondansetron (ZOFRAN ODT) 4 MG  disintegrating tablet Take 1 tablet (4 mg total) by mouth every 8 (eight) hours as needed for nausea or vomiting. 11/24/15   Ozella Almond Dniyah Grant, PA-C  traMADol (ULTRAM) 50 MG tablet Take 1 tablet (50 mg total) by mouth every 6 (six) hours as needed. 01/06/15   Lajean Saver, MD    Family History Family History  Problem Relation Age of Onset  . Healthy Mother   . Healthy Father     Social History Social History  Substance Use Topics  . Smoking status: Current Some Day Smoker    Packs/day: 0.50    Types: Cigarettes  . Smokeless tobacco: Never Used  . Alcohol use 0.0 oz/week     Comment: occasional use     Allergies   Review of patient's allergies indicates no known allergies.   Review of Systems Review of Systems  Constitutional: Negative for chills and fever.  HENT: Negative for congestion.   Eyes: Negative for visual disturbance.  Respiratory: Negative for cough and shortness of breath.   Cardiovascular: Negative.   Gastrointestinal: Positive for abdominal pain, diarrhea, nausea and vomiting. Negative for blood in stool.  Genitourinary: Negative for dysuria, frequency, urgency and vaginal discharge.  Musculoskeletal: Negative for back pain and neck pain.  Skin: Negative for rash.  Neurological: Negative for headaches.     Physical Exam Updated Vital Signs BP 125/85 (BP Location: Left Arm)   Pulse 108   Temp 98.1 F (36.7 C) (Oral)   Resp 16  Ht 5\' 3"  (1.6 m)   Wt 74.8 kg   SpO2 98%   BMI 29.23 kg/m   Physical Exam  Constitutional: She is oriented to person, place, and time. She appears well-developed and well-nourished. No distress.  HENT:  Head: Normocephalic and atraumatic.  Oropharynx clear, tacky mucous membranes.  Cardiovascular: Normal heart sounds and intact distal pulses.  Exam reveals no gallop and no friction rub.   No murmur heard. Tachycardic but regular.  Pulmonary/Chest: Effort normal and breath sounds normal. No respiratory distress. She  has no wheezes. She has no rales. She exhibits no tenderness.  Abdominal: Soft. Bowel sounds are normal. She exhibits no distension.  + Murphy's. Tenderness to palpation of epigastrium and right upper quadrant.  Musculoskeletal: Normal range of motion.  Neurological: She is alert and oriented to person, place, and time.  Skin: Skin is warm and dry.  Nursing note and vitals reviewed.   ED Treatments / Results  Labs (all labs ordered are listed, but only abnormal results are displayed) Labs Reviewed  CBC WITH DIFFERENTIAL/PLATELET - Abnormal; Notable for the following:       Result Value   RBC 5.47 (*)    Hemoglobin 16.0 (*)    Lymphs Abs 0.4 (*)    All other components within normal limits  COMPREHENSIVE METABOLIC PANEL - Abnormal; Notable for the following:    Glucose, Bld 104 (*)    All other components within normal limits  URINALYSIS, ROUTINE W REFLEX MICROSCOPIC (NOT AT [facility])  LIPASE, BLOOD    EKG  EKG Interpretation None       Radiology US Abdomen Limited Ruq  Result Date: 11/24/2015 CLINICAL DATA:  Upper abdominal pain. EXAM: US ABDOMEN LIMITED - RIGHT UPPER QUADRANT COMPARISON:  None. FINDINGS: Gallbladder: No gallstones or wall thickening visualized. No sonographic Murphy sign noted by sonographer. Common bile duct: Diameter: 2 mm Liver: No focal lesion identified. Within normal limits in parenchymal echogenicity. The portal vein is open. IMPRESSION: Normal right upper quadrant ultrasound. Electronically Signed   By: Aletta Edouard M.D.   On: 11/24/2015 16:59    Procedures Procedures (including critical care time)  Medications Ordered in ED Medications  dicyclomine (BENTYL) tablet 20 mg (20 mg Oral Not Given 11/24/15 1726)  sodium chloride 0.9 % bolus 1,000 mL (0 mLs Intravenous Stopped 11/24/15 1720)  ondansetron (ZOFRAN) injection 4 mg (4 mg Intravenous Given 11/24/15 1717)  dicyclomine (BENTYL) 10 MG capsule (20 mg  Given 11/24/15 1717)     Initial  Impression / Assessment and Plan / ED Course  I have reviewed the triage vital signs and the nursing notes.  Pertinent labs & imaging results that were available during my care of the patient were reviewed by me and considered in my medical decision making (see chart for details).  Clinical Course   Judith [name] is a 40 y.o. female who presents to ED for upper abdominal pain that began this morning associated with n/v/d. Tachycardic upon arrival with tacky mucous membranes, therefore IV fluids initiated. On exam, patient with tenderness to palpation of RUQ and epigastrium - will obtain abdominal ultrasound. Labs and urine ordered.   Labs reviewed and reassuring. Ultrasound unremarkable.  Abdominal pain improved with fluids, zofran and bentyl.  On repeat exam patient does not have a surgical abdomen and there are no peritoneal signs. Patient discharged home with symptomatic treatment and given strict instructions for follow-up with their primary care physician. I have also discussed reasons to return immediately to the  ER. Patient expresses understanding and agrees with plan.   Final Clinical Impressions(s) / ED Diagnoses   Final diagnoses:  Epigastric abdominal pain  Nausea vomiting and diarrhea    New Prescriptions New Prescriptions   ONDANSETRON (ZOFRAN ODT) 4 MG DISINTEGRATING TABLET    Take 1 tablet (4 mg total) by mouth every 8 (eight) hours as needed for nausea or vomiting.     Ozella Almond Terrie Haring, PA-C 11/24/15 1735    Veryl Speak, MD 11/24/15 2322

## 2015-11-24 NOTE — ED Notes (Signed)
Patient denies pain and is resting comfortably.  

## 2015-11-24 NOTE — ED Triage Notes (Signed)
She woke with mid to epigastric abdominal pain. It has progressed to diarrhea and vomiting.

## 2016-08-30 ENCOUNTER — Other Ambulatory Visit: Payer: Self-pay | Admitting: Orthopedic Surgery

## 2016-08-30 ENCOUNTER — Encounter (HOSPITAL_BASED_OUTPATIENT_CLINIC_OR_DEPARTMENT_OTHER): Payer: Self-pay | Admitting: *Deleted

## 2016-09-01 ENCOUNTER — Ambulatory Visit (HOSPITAL_BASED_OUTPATIENT_CLINIC_OR_DEPARTMENT_OTHER): Payer: 59 | Admitting: Anesthesiology

## 2016-09-01 ENCOUNTER — Encounter (HOSPITAL_BASED_OUTPATIENT_CLINIC_OR_DEPARTMENT_OTHER)
Admission: RE | Disposition: A | Discharge status: Home / Self Care | Payer: Self-pay | Source: Ambulatory Visit | Attending: Orthopedic Surgery

## 2016-09-01 ENCOUNTER — Ambulatory Visit (HOSPITAL_BASED_OUTPATIENT_CLINIC_OR_DEPARTMENT_OTHER)
Admission: RE | Admit: 2016-09-01 | Discharge: 2016-09-01 | Disposition: A | Discharge status: Home / Self Care | Payer: 59 | Source: Ambulatory Visit | Attending: Orthopedic Surgery | Admitting: Orthopedic Surgery

## 2016-09-01 ENCOUNTER — Encounter (HOSPITAL_BASED_OUTPATIENT_CLINIC_OR_DEPARTMENT_OTHER): Payer: Self-pay | Admitting: *Deleted

## 2016-09-01 DIAGNOSIS — M25531 Pain in right wrist: Secondary | ICD-10-CM | POA: Insufficient documentation

## 2016-09-01 DIAGNOSIS — R51 Headache: Secondary | ICD-10-CM | POA: Diagnosis not present

## 2016-09-01 DIAGNOSIS — F17210 Nicotine dependence, cigarettes, uncomplicated: Secondary | ICD-10-CM | POA: Insufficient documentation

## 2016-09-01 DIAGNOSIS — K219 Gastro-esophageal reflux disease without esophagitis: Secondary | ICD-10-CM | POA: Diagnosis not present

## 2016-09-01 DIAGNOSIS — I10 Essential (primary) hypertension: Secondary | ICD-10-CM | POA: Insufficient documentation

## 2016-09-01 DIAGNOSIS — F419 Anxiety disorder, unspecified: Secondary | ICD-10-CM | POA: Insufficient documentation

## 2016-09-01 DIAGNOSIS — Z90710 Acquired absence of both cervix and uterus: Secondary | ICD-10-CM | POA: Diagnosis not present

## 2016-09-01 DIAGNOSIS — Z888 Allergy status to other drugs, medicaments and biological substances status: Secondary | ICD-10-CM | POA: Insufficient documentation

## 2016-09-01 DIAGNOSIS — M659 Synovitis and tenosynovitis, unspecified: Secondary | ICD-10-CM | POA: Insufficient documentation

## 2016-09-01 HISTORY — DX: Unspecified synovitis and tenosynovitis, right forearm: M65.931

## 2016-09-01 HISTORY — DX: Gastro-esophageal reflux disease without esophagitis: K21.9

## 2016-09-01 HISTORY — PX: WRIST ARTHROSCOPY: SHX838

## 2016-09-01 HISTORY — DX: Essential (primary) hypertension: I10

## 2016-09-01 HISTORY — DX: Synovitis and tenosynovitis, unspecified: M65.9

## 2016-09-01 LAB — POCT I-STAT, CHEM 8
BUN: 11 mg/dL (ref 6–20)
CHLORIDE: 101 mmol/L (ref 101–111)
Calcium, Ion: 1.14 mmol/L — ABNORMAL LOW (ref 1.15–1.40)
Creatinine, Ser: 0.6 mg/dL (ref 0.44–1.00)
GLUCOSE: 107 mg/dL — AB (ref 65–99)
HCT: 45 % (ref 36.0–46.0)
Hemoglobin: 15.3 g/dL — ABNORMAL HIGH (ref 12.0–15.0)
POTASSIUM: 3.7 mmol/L (ref 3.5–5.1)
Sodium: 139 mmol/L (ref 135–145)
TCO2: 27 mmol/L (ref 0–100)

## 2016-09-01 SURGERY — ARTHROSCOPY, WRIST
Anesthesia: General | Site: Wrist | Laterality: Right

## 2016-09-01 MED ORDER — FENTANYL CITRATE (PF) 100 MCG/2ML IJ SOLN
INTRAMUSCULAR | Status: AC
  Filled 2016-09-01: qty 2

## 2016-09-01 MED ORDER — ONDANSETRON HCL 4 MG/2ML IJ SOLN
INTRAMUSCULAR | Status: AC
  Filled 2016-09-01: qty 2

## 2016-09-01 MED ORDER — CEFAZOLIN SODIUM-DEXTROSE 2-4 GM/100ML-% IV SOLN
INTRAVENOUS | Status: AC
  Filled 2016-09-01: qty 100

## 2016-09-01 MED ORDER — PROMETHAZINE HCL 25 MG/ML IJ SOLN: 6.2500 mg | INTRAMUSCULAR | Status: DC | PRN

## 2016-09-01 MED ORDER — LACTATED RINGERS IV SOLN
INTRAVENOUS | Status: DC
  Administered 2016-09-01: 12:00:00 via INTRAVENOUS

## 2016-09-01 MED ORDER — MIDAZOLAM HCL 2 MG/2ML IJ SOLN
INTRAMUSCULAR | Status: AC
  Filled 2016-09-01: qty 2

## 2016-09-01 MED ORDER — FENTANYL CITRATE (PF) 100 MCG/2ML IJ SOLN: 25.0000 ug | INTRAMUSCULAR | Status: DC | PRN

## 2016-09-01 MED ORDER — DEXAMETHASONE SODIUM PHOSPHATE 10 MG/ML IJ SOLN
INTRAMUSCULAR | Status: AC
Start: 2016-09-01 — End: 2016-09-01
  Filled 2016-09-01: qty 1

## 2016-09-01 MED ORDER — BUPIVACAINE-EPINEPHRINE (PF) 0.5% -1:200000 IJ SOLN
INTRAMUSCULAR | Status: DC | PRN
  Administered 2016-09-01: 20 mL via PERINEURAL

## 2016-09-01 MED ORDER — MIDAZOLAM HCL 2 MG/2ML IJ SOLN: 1.0000 mg | INTRAMUSCULAR | Status: DC | PRN

## 2016-09-01 MED ORDER — PROPOFOL 500 MG/50ML IV EMUL
INTRAVENOUS | Status: AC
  Filled 2016-09-01: qty 50

## 2016-09-01 MED ORDER — PROPOFOL 10 MG/ML IV BOLUS
INTRAVENOUS | Status: DC | PRN
  Administered 2016-09-01: 150 mg via INTRAVENOUS
  Administered 2016-09-01: 50 mg via INTRAVENOUS

## 2016-09-01 MED ORDER — DEXAMETHASONE SODIUM PHOSPHATE 4 MG/ML IJ SOLN
INTRAMUSCULAR | Status: DC | PRN
  Administered 2016-09-01: 10 mg via INTRAVENOUS

## 2016-09-01 MED ORDER — LIDOCAINE 2% (20 MG/ML) 5 ML SYRINGE
INTRAMUSCULAR | Status: DC | PRN
  Administered 2016-09-01: 100 mg via INTRAVENOUS

## 2016-09-01 MED ORDER — CEFAZOLIN SODIUM-DEXTROSE 2-4 GM/100ML-% IV SOLN
2.0000 g | INTRAVENOUS | Status: AC
  Administered 2016-09-01: 2 g via INTRAVENOUS

## 2016-09-01 MED ORDER — SODIUM CHLORIDE 0.9 % IR SOLN
Status: DC | PRN
  Administered 2016-09-01: 3000 mL

## 2016-09-01 MED ORDER — FENTANYL CITRATE (PF) 100 MCG/2ML IJ SOLN
50.0000 ug | INTRAMUSCULAR | Status: DC | PRN
  Administered 2016-09-01: 100 ug via INTRAVENOUS
  Administered 2016-09-01: 50 ug via INTRAVENOUS

## 2016-09-01 MED ORDER — SCOPOLAMINE 1 MG/3DAYS TD PT72: 1.0000 | MEDICATED_PATCH | Freq: Once | TRANSDERMAL | Status: DC | PRN

## 2016-09-01 MED ORDER — OXYCODONE-ACETAMINOPHEN 5-325 MG PO TABS: ORAL_TABLET | ORAL | 0 refills | Status: DC

## 2016-09-01 MED ORDER — CHLORHEXIDINE GLUCONATE 4 % EX LIQD: 60.0000 mL | Freq: Once | CUTANEOUS | Status: DC

## 2016-09-01 SURGICAL SUPPLY — 83 items
BANDAGE ACE 3X5.8 VEL STRL LF (GAUZE/BANDAGES/DRESSINGS) ×3 IMPLANT
BANDAGE ACE 4X5 VEL STRL LF (GAUZE/BANDAGES/DRESSINGS) IMPLANT
BLADE CUDA 2.0 (BLADE) IMPLANT
BLADE EAR TYMPAN 2.5 60D BEAV (BLADE) IMPLANT
BLADE MINI RND TIP GREEN BEAV (BLADE) IMPLANT
BLADE SURG 15 STRL LF DISP TIS (BLADE) ×2 IMPLANT
BLADE SURG 15 STRL SS (BLADE) ×4
BNDG ESMARK 4X9 LF (GAUZE/BANDAGES/DRESSINGS) ×3 IMPLANT
BNDG GAUZE ELAST 4 BULKY (GAUZE/BANDAGES/DRESSINGS) IMPLANT
BUR CUDA 2.9 (BURR) IMPLANT
BUR CUDA 2.9MM (BURR)
BUR FULL RADIUS 2.0 (BURR) IMPLANT
BUR FULL RADIUS 2.0MM (BURR)
BUR FULL RADIUS 2.9 (BURR) ×2 IMPLANT
BUR FULL RADIUS 2.9MM (BURR) ×1
BUR GATOR 2.9 (BURR) IMPLANT
BUR GATOR 2.9MM (BURR)
BUR SPHERICAL 2.9 (BURR) IMPLANT
BUR SPHERICAL 2.9MM (BURR)
CANISTER SUCT 1200ML W/VALVE (MISCELLANEOUS) IMPLANT
CHLORAPREP W/TINT 26ML (MISCELLANEOUS) ×3 IMPLANT
CLOSURE WOUND 1/2 X4 (GAUZE/BANDAGES/DRESSINGS)
CORD BIPOLAR FORCEPS 12FT (ELECTRODE) IMPLANT
COVER BACK TABLE 60X90IN (DRAPES) ×3 IMPLANT
CUFF TOURNIQUET SINGLE 18IN (TOURNIQUET CUFF) ×3 IMPLANT
DRAPE EXTREMITY T 121X128X90 (DRAPE) ×3 IMPLANT
DRAPE IMP U-DRAPE 54X76 (DRAPES) ×3 IMPLANT
DRAPE OEC MINIVIEW 54X84 (DRAPES) IMPLANT
DRAPE SURG 17X23 STRL (DRAPES) ×6 IMPLANT
ELECT SMALL JOINT 90D BASC (ELECTRODE) IMPLANT
GAUZE SPONGE 4X4 12PLY STRL (GAUZE/BANDAGES/DRESSINGS) ×3 IMPLANT
GAUZE XEROFORM 1X8 LF (GAUZE/BANDAGES/DRESSINGS) ×3 IMPLANT
GLOVE BIO SURGEON STRL SZ 6.5 (GLOVE) ×2 IMPLANT
GLOVE BIO SURGEON STRL SZ7.5 (GLOVE) ×3 IMPLANT
GLOVE BIO SURGEONS STRL SZ 6.5 (GLOVE) ×1
GLOVE BIOGEL PI IND STRL 7.0 (GLOVE) ×1 IMPLANT
GLOVE BIOGEL PI IND STRL 8.5 (GLOVE) ×1 IMPLANT
GLOVE BIOGEL PI INDICATOR 7.0 (GLOVE) ×2
GLOVE BIOGEL PI INDICATOR 8.5 (GLOVE) ×2
GLOVE SURG ORTHO 8.0 STRL STRW (GLOVE) ×3 IMPLANT
GOWN STRL REUS W/ TWL LRG LVL3 (GOWN DISPOSABLE) IMPLANT
GOWN STRL REUS W/TWL LRG LVL3 (GOWN DISPOSABLE)
GOWN STRL REUS W/TWL XL LVL3 (GOWN DISPOSABLE) ×6 IMPLANT
IV SET EXTENSION GRAVITY 40 LF (IV SETS) ×3 IMPLANT
MANIFOLD NEPTUNE II (INSTRUMENTS) IMPLANT
NDL SAFETY ECLIPSE 18X1.5 (NEEDLE) ×2 IMPLANT
NEEDLE HYPO 18GX1.5 SHARP (NEEDLE) ×4
NEEDLE HYPO 22GX1.5 SAFETY (NEEDLE) ×3 IMPLANT
NEEDLE SPNL 18GX3.5 QUINCKE PK (NEEDLE) IMPLANT
NEEDLE TUOHY 20GX3.5 (NEEDLE) IMPLANT
PACK BASIN DAY SURGERY FS (CUSTOM PROCEDURE TRAY) ×3 IMPLANT
PAD CAST 3X4 CTTN HI CHSV (CAST SUPPLIES) ×1 IMPLANT
PADDING CAST ABS 3INX4YD NS (CAST SUPPLIES) ×2
PADDING CAST ABS 4INX4YD NS (CAST SUPPLIES) ×2
PADDING CAST ABS COTTON 3X4 (CAST SUPPLIES) ×1 IMPLANT
PADDING CAST ABS COTTON 4X4 ST (CAST SUPPLIES) ×1 IMPLANT
PADDING CAST COTTON 3X4 STRL (CAST SUPPLIES) ×2
PROBE BIPOLAR ARTHRO 85MM 30D (MISCELLANEOUS) IMPLANT
ROUTER HOODED VORTEX 2.9MM (BLADE) IMPLANT
SET ARTHROSCOPY TUBING (MISCELLANEOUS)
SET ARTHROSCOPY TUBING LN (MISCELLANEOUS) IMPLANT
SET SM JOINT TUBING/CANN (CANNULA) IMPLANT
SLEEVE SCD COMPRESS KNEE MED (MISCELLANEOUS) IMPLANT
SPLINT PLASTER CAST XFAST 3X15 (CAST SUPPLIES) IMPLANT
SPLINT PLASTER XTRA FASTSET 3X (CAST SUPPLIES)
STOCKINETTE 4X48 STRL (DRAPES) ×3 IMPLANT
STRIP CLOSURE SKIN 1/2X4 (GAUZE/BANDAGES/DRESSINGS) IMPLANT
SUCTION FRAZIER HANDLE 10FR (MISCELLANEOUS)
SUCTION TUBE FRAZIER 10FR DISP (MISCELLANEOUS) IMPLANT
SUT ETHILON 4 0 PS 2 18 (SUTURE) ×3 IMPLANT
SUT ETHILON 5 0 P 3 18 (SUTURE)
SUT MERSILENE 4 0 P 3 (SUTURE) IMPLANT
SUT NYLON ETHILON 5-0 P-3 1X18 (SUTURE) IMPLANT
SUT PDS AB 2-0 CT2 27 (SUTURE) IMPLANT
SUT STEEL 4 0 (SUTURE) IMPLANT
SUT VIC AB 2-0 PS2 27 (SUTURE) IMPLANT
SUT VICRYL 4-0 PS2 18IN ABS (SUTURE) IMPLANT
SYR BULB 3OZ (MISCELLANEOUS) IMPLANT
SYR CONTROL 10ML LL (SYRINGE) ×3 IMPLANT
TUBE CONNECTING 20'X1/4 (TUBING) ×1
TUBE CONNECTING 20X1/4 (TUBING) ×2 IMPLANT
UNDERPAD 30X30 (UNDERPADS AND DIAPERS) ×3 IMPLANT
WATER STERILE IRR 1000ML POUR (IV SOLUTION) ×3 IMPLANT

## 2016-09-01 NOTE — Transfer of Care (Signed)
Immediate Anesthesia Transfer of Care Note  Patient: Judith [name]  Procedure(s) Performed: Procedure(s): RIGHT WRIST ARTHROSCOPY WITH DEBRIDEMENT (Right)  Patient Location: PACU  Anesthesia Type:General  Level of Consciousness: awake and sedated  Airway & Oxygen Therapy: Patient Spontanous Breathing and Patient connected to face mask oxygen  Post-op Assessment: Report given to RN and Post -op Vital signs reviewed and stable  Post vital signs: Reviewed and stable  Last Vitals:  Vitals:   09/01/16 1255 09/01/16 1256  BP: (!) 128/93   Pulse: 70 70  Resp: 14 16  Temp:      Last Pain:  Vitals:   09/01/16 1150  TempSrc: Oral         Complications: No apparent anesthesia complications

## 2016-09-01 NOTE — Anesthesia Procedure Notes (Signed)
Procedure Name: LMA Insertion Performed by: Vail Vuncannon W Pre-anesthesia Checklist: Patient identified, Emergency Drugs available, Suction available and Patient being monitored Patient Re-evaluated:Patient Re-evaluated prior to induction Oxygen Delivery Method: Circle system utilized Preoxygenation: Pre-oxygenation with 100% oxygen Induction Type: IV induction Ventilation: Mask ventilation without difficulty LMA: LMA inserted LMA Size: 4.0 Number of attempts: 1 Placement Confirmation: positive ETCO2 Tube secured with: Tape Dental Injury: Teeth and Oropharynx as per pre-operative assessment        

## 2016-09-01 NOTE — Op Note (Signed)
I assisted Surgeon(s) and Role:    Leanora Cover, MD - Primary on the Procedure(s): RIGHT WRIST ARTHROSCOPY WITH DEBRIDEMENT on 09/01/2016.  I provided assistance on this case as follows: set-up, stabilization of the extremity,portal establishment, interpretation of findings, debridement, closure and application of dressings and splinting. I was present for the entire case.  Electronically signed by: Wynonia Sours, MD Date: 09/01/2016 Time: 2:53 PM

## 2016-09-01 NOTE — Anesthesia Postprocedure Evaluation (Signed)
Anesthesia Post Note  Patient: Judith [name]  Procedure(s) Performed: Procedure(s) (LRB): RIGHT WRIST ARTHROSCOPY WITH DEBRIDEMENT (Right)     Patient location during evaluation: PACU Anesthesia Type: General Level of consciousness: sedated Pain management: pain level controlled Vital Signs Assessment: post-procedure vital signs reviewed and stable Respiratory status: spontaneous breathing and respiratory function stable Cardiovascular status: stable Anesthetic complications: no    Last Vitals:  Vitals:   09/01/16 1530 09/01/16 1603  BP: 127/88 (!) 134/97  Pulse: 82 85  Resp: (!) 22 18  Temp:  36.5 C    Last Pain:  Vitals:   09/01/16 1603  TempSrc: Oral  PainSc: 0-No pain                 Alany Borman DANIEL

## 2016-09-01 NOTE — Brief Op Note (Signed)
09/01/2016  2:52 PM  PATIENT:  Judith [name]  41 y.o. female  PRE-OPERATIVE DIAGNOSIS:  RIGHT WRIST TENOSYNOVITIS WITH ULNAR ABUTMENT  POST-OPERATIVE DIAGNOSIS:  RIGHT WRIST TENOSYNOVITIS WITH ULNAR ABUTMENT  PROCEDURE:  Procedure(s): RIGHT WRIST ARTHROSCOPY WITH DEBRIDEMENT (Right)  SURGEON:  Surgeon(s) and Role:    * Leanora Cover, MD - Primary  PHYSICIAN ASSISTANT:   ASSISTANTS: Daryll Brod, MD   ANESTHESIA:   regional and general  EBL:  Total I/O In: 200 [I.V.:200] Out: 5 [Blood:5]  BLOOD ADMINISTERED:none  DRAINS: none   LOCAL MEDICATIONS USED:  NONE  SPECIMEN:  No Specimen  DISPOSITION OF SPECIMEN:  N/A  COUNTS:  YES  TOURNIQUET:    DICTATION: .Other Dictation: Dictation Number [id number]  PLAN OF CARE: Discharge to home after PACU  PATIENT DISPOSITION:  PACU - hemodynamically stable.

## 2016-09-01 NOTE — H&P (Signed)
  Judith [name] is an 41 y.o. female.   Chief Complaint: right wrist pain HPI: 41 yo female with right wrist pain.  Has been treated with splinting, antiinflammatories, and injection.  Some relief but not lasting.  She wishes to undergo wrist arthroscopy with debridement.  Allergies:  Allergies  Allergen Reactions  . Ambien [Zolpidem] Other (See Comments)    Sleep walking, nightmares    Past Medical History:  Diagnosis Date  . Allergy   . Anxiety   . External hemorrhoid   . GERD (gastroesophageal reflux disease)   . Headache(784.0)   . Hypertension   . Tenosynovitis of right wrist     Past Surgical History:  Procedure Laterality Date  . ABDOMINAL HYSTERECTOMY     partial  . BREAST ENHANCEMENT SURGERY  2009  . HEMORROIDECTOMY    . PARTIAL HYSTERECTOMY  2012  . VAGINAL DELIVERY  1996, 1998, 2002    Family History: Family History  Problem Relation Age of Onset  . Healthy Mother   . Healthy Father     Social History:   reports that she has been smoking Cigarettes.  She has been smoking about 0.25 packs per day. She has never used smokeless tobacco. She reports that she drinks alcohol. She reports that she does not use drugs.  Medications: No prescriptions prior to admission.    No results found for this or any previous visit (from the past 48 hour(s)).  No results found.   A comprehensive review of systems was negative.  Height 5\' 3"  (1.6 m), weight 83.9 kg (185 lb).  General appearance: alert, cooperative and appears stated age Head: Normocephalic, without obvious abnormality, atraumatic Neck: supple, symmetrical, trachea midline Resp: clear to auscultation bilaterally Cardio: regular rate and rhythm GI: non-tender Extremities: Intact sensation and capillary refill all digits.  +epl/fpl/io.  No wounds.  Pulses: 2+ and symmetric Skin: Skin color, texture, turgor normal. No rashes or lesions Neurologic: Grossly  normal Incision/Wound:none  Assessment/Plan Right wrist pain with possible impaction.  Plan wrist arthroscopy with debridement as necessary.  Non operative and operative treatment options were discussed with the patient and patient wishes to proceed with operative treatment. Risks, benefits, and alternatives of surgery were discussed and the patient agrees with the plan of care.   Judith [name] R 09/01/2016, 11:15 AM

## 2016-09-01 NOTE — Anesthesia Procedure Notes (Signed)
Anesthesia Regional Block: Supraclavicular block   Pre-Anesthetic Checklist: ,, timeout performed, Correct Patient, Correct Site, Correct Laterality, Correct Procedure, Correct Position, site marked, Risks and benefits discussed,  Surgical consent,  Pre-op evaluation,  At surgeon's request and post-op pain management  Laterality: Right  Prep: chloraprep       Needles:  Injection technique: Single-shot  Needle Type: Echogenic Needle     Needle Length: 9cm  Needle Gauge: 21     Additional Needles:   Procedures: ultrasound guided,,,,,,,,  Narrative:  Start time: 09/01/2016 12:50 PM End time: 09/01/2016 12:55 PM Injection made incrementally with aspirations every 5 mL.  Performed by: Personally  Anesthesiologist: Catalina Gravel  Additional Notes: No pain on injection. No increased resistance to injection. Injection made in 5cc increments.  Good needle visualization.  Patient tolerated procedure well.

## 2016-09-01 NOTE — Anesthesia Preprocedure Evaluation (Addendum)
Anesthesia Evaluation  Patient identified by MRN, date of birth, ID band Patient awake    Reviewed: Allergy & Precautions, NPO status , Patient's Chart, lab work & pertinent test results  Airway Mallampati: II  TM Distance: >3 FB Neck ROM: Full    Dental  (+) Teeth Intact, Dental Advisory Given   Pulmonary Current Smoker,    Pulmonary exam normal breath sounds clear to auscultation       Cardiovascular hypertension, Pt. on medications Normal cardiovascular exam Rhythm:Regular Rate:Normal     Neuro/Psych  Headaches, PSYCHIATRIC DISORDERS Anxiety    GI/Hepatic Neg liver ROS, GERD  Medicated and Controlled,  Endo/Other  negative endocrine ROSObesity   Renal/GU negative Renal ROS     Musculoskeletal RIGHT WRIST TENOSYNOVITIS WITH ULNAR ABUTMENT   Abdominal   Peds  Hematology negative hematology ROS (+)   Anesthesia Other Findings Day of surgery medications reviewed with the patient.  Reproductive/Obstetrics                            Anesthesia Physical Anesthesia Plan  ASA: II  Anesthesia Plan: General   Post-op Pain Management:  Regional for Post-op pain   Induction: Intravenous  PONV Risk Score and Plan: 2 and Ondansetron and Dexamethasone  Airway Management Planned: LMA  Additional Equipment:   Intra-op Plan:   Post-operative Plan: Extubation in OR  Informed Consent: I have reviewed the patients History and Physical, chart, labs and discussed the procedure including the risks, benefits and alternatives for the proposed anesthesia with the patient or authorized representative who has indicated his/her understanding and acceptance.   Dental advisory given  Plan Discussed with: CRNA  Anesthesia Plan Comments: (Risks/benefits of general anesthesia discussed with patient including risk of damage to teeth, lips, gum, and tongue, nausea/vomiting, allergic reactions to medications,  and the possibility of heart attack, stroke and death.  All patient questions answered.  Patient wishes to proceed.)       Anesthesia Quick Evaluation

## 2016-09-01 NOTE — Discharge Instructions (Addendum)
° °  ° ° ° °[facility] Center Instructions °Hand Surgery ° °Wound Care: °Keep your hand elevated above the level of your heart.  Do not allow it to dangle by your side.  Keep the dressing dry and do not remove it unless your doctor advises you to do so.  He will usually change it at the time of your post-op visit.  Moving your fingers is advised to stimulate circulation but will depend on the site of your surgery.  If you have a splint applied, your doctor will advise you regarding movement. ° °Activity: °Do not drive or operate machinery today.  Rest today and then you may return to your normal activity and work as indicated by your physician. ° °Diet:  °Drink liquids today or eat a light diet.  You may resume a regular diet tomorrow.   ° °General expectations: °Pain for two to three days. °Fingers may become slightly swollen. ° °Call your doctor if any of the following occur: °Severe pain not relieved by pain medication. °Elevated temperature. °Dressing soaked with blood. °Inability to move fingers. °White or bluish color to fingers. ° ° °Regional Anesthesia Blocks ° °1. Numbness or the inability to move the "blocked" extremity may last from 3-48 hours after placement. The length of time depends on the medication injected and your individual response to the medication. If the numbness is not going away after 48 hours, call your surgeon. ° °2. The extremity that is blocked will need to be protected until the numbness is gone and the  Strength has returned. Because you cannot feel it, you will need to take extra care to avoid injury. Because it may be weak, you may have difficulty moving it or using it. You may not know what position it is in without looking at it while the block is in effect. ° °3. For blocks in the legs and feet, returning to weight bearing and walking needs to be done carefully. You will need to wait until the numbness is entirely gone and the strength has returned. You should be able to move your leg  and foot normally before you try and bear weight or walk. You will need someone to be with you when you first try to ensure you do not fall and possibly risk injury. ° °4. Bruising and tenderness at the needle site are common side effects and will resolve in a few days. ° °5. Persistent numbness or new problems with movement should be communicated to the surgeon or the [facility] ([phone])/ [facility] ([phone]). ° ° ° °Post Anesthesia Home Care Instructions ° °Activity: °Get plenty of rest for the remainder of the day. A responsible individual must stay with you for 24 hours following the procedure.  °For the next 24 hours, DO NOT: °-Drive a car °-Operate machinery °-Drink alcoholic beverages °-Take any medication unless instructed by your physician °-Make any legal decisions or sign important papers. ° °Meals: °Start with liquid foods such as gelatin or soup. Progress to regular foods as tolerated. Avoid greasy, spicy, heavy foods. If nausea and/or vomiting occur, drink only clear liquids until the nausea and/or vomiting subsides. Call your physician if vomiting continues. ° °Special Instructions/Symptoms: °Your throat may feel dry or sore from the anesthesia or the breathing tube placed in your throat during surgery. If this causes discomfort, gargle with warm salt water. The discomfort should disappear within 24 hours. ° °If you had a scopolamine patch placed behind your ear for   the management of post- operative nausea and/or vomiting: ° °1. The medication in the patch is effective for 72 hours, after which it should be removed.  Wrap patch in a tissue and discard in the trash. Wash hands thoroughly with soap and water. °2. You may remove the patch earlier than 72 hours if you experience unpleasant side effects which may include dry mouth, dizziness or visual disturbances. °3. Avoid touching the patch. Wash your hands with soap and water after contact with the patch. °  ° ° °

## 2016-09-01 NOTE — Progress Notes (Signed)
Assisted Dr. Turk with right, ultrasound guided, supraclavicular block. Side rails up, monitors on throughout procedure. See vital signs in flow sheet. Tolerated Procedure well. 

## 2016-09-01 NOTE — Op Note (Signed)
[id number] 

## 2016-09-01 NOTE — Op Note (Signed)
Judith [name], Judith [name]            ACCOUNT NO.:  [id number]  MEDICAL RECORD NO.:  [id number]  LOCATION:                                 FACILITY:  PHYSICIAN:  Leanora Cover, MD             DATE OF BIRTH:  DATE OF PROCEDURE:  09/01/2016 DATE OF DISCHARGE:                              OPERATIVE REPORT   PREOPERATIVE DIAGNOSIS:  Right wrist pain with possible ulnocarpal impaction.  POSTOPERATIVE DIAGNOSIS:  Right wrist pain with synovitis.  PROCEDURE:  Right wrist arthroscopy with debridement.  SURGEON:  Leanora Cover, MD  ASSISTANT:  Daryll Brod, M.D.  ANESTHESIA:  General with regional.  IV FLUIDS:  Per anesthesia flow sheet.  ESTIMATED BLOOD LOSS:  Minimal.  COMPLICATIONS:  None.  SPECIMENS:  None.  TOURNIQUET TIME:  None.  DISPOSITION:  Stable to PACU.  INDICATIONS:  Judith [name] is a 41 year old female who has had pain in her right wrist.  This has been splinted and injected.  She had some relief with the injection, but her pain has recurred.  She wished to have a diagnostic arthroscopy with potential debridement.  Risks, benefits and alternatives of the surgery were discussed including the risk of blood loss; infection; damage to nerves, vessels, tendons, ligaments, bone; failure of surgery; need for additional surgery; complications with wound healing; continued pain.  She voiced understanding of these risks and elected to proceed.  OPERATIVE COURSE:  After being identified preoperatively by myself, the patient and I agreed upon the procedure and site of procedure.  Surgical site was marked.  The risks, benefits and alternatives of the surgery were reviewed and she wished to proceed.  Surgical consent had been signed.  She was given IV Ancef as preoperative antibiotic prophylaxis. She was transferred to the operating room and placed on the operating room table in supine position with the right upper extremity on armboard.  General anesthesia was induced by  anesthesiologist.  A regional block had been performed by Anesthesia in preoperative holding. Right upper extremity was prepped and draped in normal sterile orthopedic fashion.  Surgical pause was performed between the surgeons, anesthesia, and operating room staff; and all were in agreement as to the patient, procedure and site of procedure.  Tourniquet at the proximal aspect of the extremity was never inflated.  The forearm and hand were secured in the arthroscopy tower.  A standard 3-4 portal was made through the skin only.  The joint was then entered in blunt technique after insufflation of the joint with sterile saline.  The trocar was introduced and the camera introduced.  The wrist was examined.  The cartilage was intact.  There was some fraying of the volar and dorsal capsule.  There was one linear demarcation between the scaphoid and lunate facets with some increased bunched-up-appearing articular cartilage.  The TFCC was intact.  There was no central tear. It was spongy, however.  At the ulnar styloid, the prestyloid recess was identified.  There was no tear noted at the periphery.  There was significant synovitis, however.  Additional 4-5 portal was made and the probe introduced to palpate these structures.  The scapholunate and LT ligaments were also  noted to be intact.  The arthroscopy wand was introduced and the synovitis coagulated.  The shaver was introduced and any frayed tissues debrided as well.  Overall, the joint appeared very good.  The midcarpal joint was entered.  The SL and LT intervals were tight.  There was a small step-off at the SL interval.  The capitohamate joint was distinctly visible.  This did not appear unstable, however. The arthroscopy equipment was removed.  The portals were closed with 4-0 nylon in a horizontal mattress fashion.  They were dressed with sterile Xeroform, 4x4s, and wrapped with a Kerlix bandage.  A volar splint was placed and wrapped  with Kerlix and Ace bandage.  The operative drapes were broken down, the patient was awoken from anesthesia safely.  She was transferred back to the stretcher and taken to PACU in stable condition.  I will see her back in the office in 1 week for postoperative followup.  I will give her Percocet 5/325 one to two p.o. q.6 hours p.r.n. pain, dispensed #20.     Leanora Cover, MD     KK/MEDQ  D:  09/01/2016  T:  09/01/2016  Job:  [id number]

## 2016-09-02 ENCOUNTER — Encounter (HOSPITAL_BASED_OUTPATIENT_CLINIC_OR_DEPARTMENT_OTHER): Payer: Self-pay | Admitting: Orthopedic Surgery

## 2016-10-28 ENCOUNTER — Other Ambulatory Visit: Payer: Self-pay | Admitting: Obstetrics and Gynecology

## 2016-10-28 DIAGNOSIS — R928 Other abnormal and inconclusive findings on diagnostic imaging of breast: Secondary | ICD-10-CM

## 2016-11-02 ENCOUNTER — Other Ambulatory Visit: Payer: Self-pay | Admitting: Obstetrics and Gynecology

## 2016-11-02 ENCOUNTER — Ambulatory Visit
Admission: RE | Admit: 2016-11-02 | Discharge: 2016-11-02 | Disposition: A | Discharge status: Home / Self Care | Payer: 59 | Source: Ambulatory Visit | Attending: Obstetrics and Gynecology | Admitting: Obstetrics and Gynecology

## 2016-11-02 DIAGNOSIS — R928 Other abnormal and inconclusive findings on diagnostic imaging of breast: Secondary | ICD-10-CM

## 2016-11-02 DIAGNOSIS — N63 Unspecified lump in unspecified breast: Secondary | ICD-10-CM

## 2017-05-04 ENCOUNTER — Other Ambulatory Visit: Payer: 59

## 2017-06-04 DIAGNOSIS — J209 Acute bronchitis, unspecified: Secondary | ICD-10-CM | POA: Diagnosis not present

## 2018-02-20 DIAGNOSIS — Z6834 Body mass index (BMI) 34.0-34.9, adult: Secondary | ICD-10-CM | POA: Diagnosis not present

## 2018-02-20 DIAGNOSIS — Z01419 Encounter for gynecological examination (general) (routine) without abnormal findings: Secondary | ICD-10-CM | POA: Diagnosis not present

## 2018-02-21 ENCOUNTER — Other Ambulatory Visit: Payer: Self-pay | Admitting: Obstetrics and Gynecology

## 2018-02-21 DIAGNOSIS — N6320 Unspecified lump in the left breast, unspecified quadrant: Secondary | ICD-10-CM

## 2018-02-27 ENCOUNTER — Ambulatory Visit
Admission: RE | Admit: 2018-02-27 | Discharge: 2018-02-27 | Disposition: A | Discharge status: Home / Self Care | Payer: 59 | Source: Ambulatory Visit | Attending: Obstetrics and Gynecology | Admitting: Obstetrics and Gynecology

## 2018-02-27 DIAGNOSIS — N6320 Unspecified lump in the left breast, unspecified quadrant: Secondary | ICD-10-CM

## 2018-02-27 DIAGNOSIS — R922 Inconclusive mammogram: Secondary | ICD-10-CM | POA: Diagnosis not present

## 2018-02-27 DIAGNOSIS — N6012 Diffuse cystic mastopathy of left breast: Secondary | ICD-10-CM | POA: Diagnosis not present

## 2018-02-28 DIAGNOSIS — R030 Elevated blood-pressure reading, without diagnosis of hypertension: Secondary | ICD-10-CM | POA: Diagnosis not present

## 2018-02-28 DIAGNOSIS — R5383 Other fatigue: Secondary | ICD-10-CM | POA: Diagnosis not present

## 2018-02-28 DIAGNOSIS — E785 Hyperlipidemia, unspecified: Secondary | ICD-10-CM | POA: Diagnosis not present

## 2018-03-13 DIAGNOSIS — N6320 Unspecified lump in the left breast, unspecified quadrant: Secondary | ICD-10-CM | POA: Diagnosis not present

## 2018-03-13 DIAGNOSIS — Z760 Encounter for issue of repeat prescription: Secondary | ICD-10-CM | POA: Diagnosis not present

## 2018-04-25 ENCOUNTER — Other Ambulatory Visit (HOSPITAL_COMMUNITY): Payer: Self-pay | Admitting: Gastroenterology

## 2018-04-25 ENCOUNTER — Other Ambulatory Visit: Payer: Self-pay | Admitting: Gastroenterology

## 2018-04-25 DIAGNOSIS — R1112 Projectile vomiting: Secondary | ICD-10-CM

## 2018-05-04 ENCOUNTER — Ambulatory Visit (HOSPITAL_COMMUNITY): Payer: 59

## 2018-06-14 ENCOUNTER — Ambulatory Visit (HOSPITAL_COMMUNITY): Payer: 59

## 2018-06-20 ENCOUNTER — Ambulatory Visit (HOSPITAL_COMMUNITY): Payer: 59

## 2018-09-25 ENCOUNTER — Ambulatory Visit
Admission: RE | Admit: 2018-09-25 | Discharge: 2018-09-25 | Disposition: A | Discharge status: Home / Self Care | Payer: No Typology Code available for payment source | Source: Ambulatory Visit | Attending: Nurse Practitioner | Admitting: Nurse Practitioner

## 2018-09-25 ENCOUNTER — Other Ambulatory Visit: Payer: Self-pay | Admitting: Nurse Practitioner

## 2018-09-25 DIAGNOSIS — M25579 Pain in unspecified ankle and joints of unspecified foot: Secondary | ICD-10-CM

## 2018-09-25 DIAGNOSIS — M25471 Effusion, right ankle: Secondary | ICD-10-CM

## 2020-09-01 ENCOUNTER — Ambulatory Visit: Payer: 59 | Admitting: Allergy and Immunology

## 2020-09-07 ENCOUNTER — Ambulatory Visit: Payer: 59 | Admitting: Allergy

## 2020-09-07 ENCOUNTER — Other Ambulatory Visit: Payer: Self-pay

## 2020-09-07 ENCOUNTER — Encounter: Payer: Self-pay | Admitting: Allergy

## 2020-09-07 VITALS — BP 128/76 | HR 98 | Temp 98.4°F | Resp 16 | Ht 63.5 in | Wt 181.0 lb

## 2020-09-07 DIAGNOSIS — H1013 Acute atopic conjunctivitis, bilateral: Secondary | ICD-10-CM

## 2020-09-07 DIAGNOSIS — L508 Other urticaria: Secondary | ICD-10-CM | POA: Diagnosis not present

## 2020-09-07 DIAGNOSIS — J3089 Other allergic rhinitis: Secondary | ICD-10-CM | POA: Diagnosis not present

## 2020-09-07 DIAGNOSIS — T781XXD Other adverse food reactions, not elsewhere classified, subsequent encounter: Secondary | ICD-10-CM

## 2020-09-07 DIAGNOSIS — T783XXD Angioneurotic edema, subsequent encounter: Secondary | ICD-10-CM

## 2020-09-07 NOTE — Patient Instructions (Addendum)
Hives and swelling -at this time etiology of hives and swelling is unknown.  Hives can be caused by a variety of different triggers including illness/infection, foods, medications, stings, exercise, pressure, vibrations, extremes of temperature to name a few however majority of the time there is no identifiable trigger.  Your symptoms have been ongoing for >6 weeks making this chronic thus will obtain labwork to evaluate: CBC w diff, CMP, tryptase, hive panel, alpha-gal panel -environmental allergy panel is positive to tree pollen, weed pollen, grass pollen, mold and dog -food allergy testing is negative to pineapple, orange, apple, ginger -if you were to have hives again recommend taking Zyrtec '10mg'$  with Pepcid '20mg'$  starting once a day.  If hives persist increase to twice a day dosing of both medications -should significant symptoms recur or new symptoms occur, a journal is to be kept recording any foods eaten, beverages consumed, medications taken, activities performed, and environmental conditions within a 6 hour time period prior to the onset of symptoms.   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  This is mostly likely what is occurring with fresh fruit ingestion.   The oral allergy syndrome (OAS) or pollen-food allergy syndrome (PFAS) is a relatively common form of food allergy, particularly in adults. It typically occurs in people who have pollen allergies when the immune system "sees" proteins on the food that look like proteins on the pollen. This results in the allergy antibody (IgE) binding to the food instead of the pollen. Patients typically report itching and/or mild swelling of the mouth and throat immediately following ingestion of certain uncooked fruits (including nuts) or raw vegetables. Only a very small number of affected individuals experience systemic allergic reactions, such as anaphylaxis which occurs with true food allergies.       - we have discussed the following in  regards to foods:   Allergy: food allergy is when you have eaten a food, developed an allergic reaction after eating the food and have IgE to the food (positive food testing either by skin testing or blood testing).  Food allergy could lead to life threatening symptoms  Sensitivity: occurs when you have IgE to a food (positive food testing either by skin testing or blood testing) but is a food you eat without any issues.  This is not an allergy and we recommend keeping the food in the diet  Intolerance: this is when you have negative testing by either skin testing or blood testing thus not allergic but the food causes symptoms (like belly pain, bloating, diarrhea etc) with ingestion.  These foods should be avoided to prevent symptoms.     Environmental allergy -enviornmental allergy skin testing as above -allergen avoidance measures discussed/handouts provided -can use Zyrtec '10mg'$  daily as needed.  Other antihistamines you can use include Allegra '180mg'$  or Xyzal '5mg'$  daily as needed -for itchy, watery, red eyes can use OTC Pataday 1 drop each eye daily as needed -for nasal congestion/drainage can use nasal steroid spray like Flonase, Rhinocort, Nasacort 2 sprays each nostril daily for 1-2 weeks at a time before stopping once nasal congestion improves for maximum benefit  Follow-up in 3 months or sooner if needed

## 2020-09-07 NOTE — Progress Notes (Signed)
New Patient Note  RE: [name] MRN: [id number] DOB: 01/10/1976 Date of Office Visit: 09/07/2020  Primary care provider: Glenis Smoker, MD  Chief Complaint: hives  History of present illness: Judith [name] is a 45 y.o. female presenting today for evaluation of urticaria and swelling.  Several years ago she reports she would break out in hives after exercising.  She also reports if she was in the sun for too long she would get prickly skin and itching.   She saw a doctor for this back then and states she was told she had urticaria.   Over the past 4-5 months now she has developed itchy/scratchy throat and hives on arms and back after eating pineapple.  She states she would eat pineapple for breakfast often but has stopped.  She had another incident after drinking juiced celery, beets, cucumbers, carrots, ginger, oranges, apples where she developed lip swelling immediately and took benadryl.  No difficulty breathing and denies GI or CV related symptoms.  She stopped eating oranges and pineapples.   She still eats beets, celery, cucumber, carrots without issue.   She is not sure about tomatoes but has had tomatoes without any symptoms.  She believes her last episode of any hives or swelling was in early June.   She has a dog that sleeps in her room.  With the hive and swelling episodes as above she stopped letting dog in the room.  She does feel her symptoms has lessened with this thus does not know if she has a dog allergy.   She does report having seasonal allergies really bad about a decade ago and lately reports symptoms are not that bad to take anything else.  Has had symptoms of congestion, drainage, sneezing, itchy watery eyes.  No history of asthma, previous food allergy or eczema.   Review of systems in the past 4 weeks: Review of Systems  Constitutional: Negative.   HENT: Negative.    Eyes: Negative.   Respiratory: Negative.    Cardiovascular: Negative.    Gastrointestinal: Negative.   Musculoskeletal: Negative.   Skin: Negative.   Neurological: Negative.    All other systems negative unless noted above in HPI  Past medical history: Past Medical History:  Diagnosis Date   Allergy    Anxiety    External hemorrhoid    GERD (gastroesophageal reflux disease)    Headache(784.0)    Hypertension    Tenosynovitis of right wrist     Past surgical history: Past Surgical History:  Procedure Laterality Date   ABDOMINAL HYSTERECTOMY     partial   AUGMENTATION MAMMAPLASTY Bilateral    BREAST ENHANCEMENT SURGERY  2009   HEMORROIDECTOMY     PARTIAL HYSTERECTOMY  2010   [place], 2002   WRIST ARTHROSCOPY Right 09/01/2016   Procedure: RIGHT WRIST ARTHROSCOPY WITH DEBRIDEMENT;  Surgeon: Leanora Cover, MD;  Location: [facility];  Service: Orthopedics;  Laterality: Right;    Family history:  Family History  Problem Relation Age of Onset   Healthy Mother    Healthy Father    Eczema Daughter    Food Allergy Daughter    Food Allergy Daughter    Eczema Daughter    Eczema Daughter    Asthma Neg Hx    Allergic rhinitis Neg Hx    Urticaria Neg Hx    Angioedema Neg Hx    Immunodeficiency Neg Hx     Social history: Lives in a home  with carpeting with gas heating and central cooling.  There is a dog and bearded dragon in the home.  There is no concern for water damage, mildew or roaches in the home. Occupational History   Occupation: Engineer, structural  Tobacco Use   Smoking status: Some Days    Packs/week: 1.5 since 2008    Types: Cigarettes, reports   Smokeless tobacco: Never  Vaping Use   Vaping Use: Never used     Medication List: Current Outpatient Medications  Medication Sig Dispense Refill   atorvastatin (LIPITOR) 20 MG tablet Take 20 mg by mouth daily.     levothyroxine (SYNTHROID) 50 MCG tablet Take 50 mcg by mouth daily.     pantoprazole (PROTONIX) 40 MG tablet Take by mouth.      Potassium Chloride ER 20 MEQ TBCR Take 1 tablet by mouth daily.     triamterene-hydrochlorothiazide (DYAZIDE) 37.5-25 MG capsule Take 1 capsule by mouth daily. HCTZ     No current facility-administered medications for this visit.    Known medication allergies: Allergies  Allergen Reactions   Ambien [Zolpidem] Other (See Comments)    Sleep walking, nightmares     Physical examination: Blood pressure 128/76, pulse 98, temperature 98.4 F (36.9 C), temperature source Temporal, resp. rate 16, height 5' 3.5" (1.613 m), weight 181 lb (82.1 kg), SpO2 99 %.  General: Alert, interactive, in no acute distress. HEENT: PERRLA, TMs pearly gray, turbinates non-edematous without discharge, post-pharynx non erythematous. Neck: Supple without lymphadenopathy. Lungs: Clear to auscultation without wheezing, rhonchi or rales. {no increased work of breathing. CV: Normal S1, S2 without murmurs. Abdomen: Nondistended, nontender. Skin: Warm and dry, without lesions or rashes. Extremities:  No clubbing, cyanosis or edema. Neuro:   Grossly intact.  Diagnositics/Labs:  Allergy testing: Environmental allergy skin prick testing is positive to tree pollen, weed pollen, grass pollen, mold and dog. Select food allergy skin prick testing is negative to pineapple, apple, orange and ginger. Allergy testing results were read and interpreted by provider, documented by clinical staff.   Assessment and plan: Urticaria with angioedema, chronic -at this time etiology of hives and swelling is unknown however she does have component of exercise-induced urticaria as well as cholinergic urticaria related to sun/heat exposure.  Hives can be caused by a variety of different triggers including illness/infection, foods, medications, stings, exercise, pressure, vibrations, extremes of temperature to name a few however majority of the time there is no identifiable trigger.  Your symptoms have been ongoing for >6 weeks making this  chronic thus will obtain labwork to evaluate: CBC w diff, CMP, tryptase, hive panel, alpha-gal panel -environmental allergy panel is positive to tree pollen, weed pollen, grass pollen, mold and dog -food allergy testing is negative to pineapple, orange, apple, ginger -if you were to have hives again recommend taking Zyrtec '10mg'$  with Pepcid '20mg'$  starting once a day.  If hives persist increase to twice a day dosing of both medications -should significant symptoms recur or new symptoms occur, a journal is to be kept recording any foods eaten, beverages consumed, medications taken, activities performed, and environmental conditions within a 6 hour time period prior to the onset of symptoms.   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  This is mostly likely what is occurring with fresh fruit ingestion.   The oral allergy syndrome (OAS) or pollen-food allergy syndrome (PFAS) is a relatively common form of food allergy, particularly in adults. It typically occurs in people who have pollen allergies when the immune  system "sees" proteins on the food that look like proteins on the pollen. This results in the allergy antibody (IgE) binding to the food instead of the pollen. Patients typically report itching and/or mild swelling of the mouth and throat immediately following ingestion of certain uncooked fruits (including nuts) or raw vegetables. Only a very small number of affected individuals experience systemic allergic reactions, such as anaphylaxis which occurs with true food allergies.    - we have discussed the following in regards to foods:   Allergy: food allergy is when you have eaten a food, developed an allergic reaction after eating the food and have IgE to the food (positive food testing either by skin testing or blood testing).  Food allergy could lead to life threatening symptoms  Sensitivity: occurs when you have IgE to a food (positive food testing either by skin testing or blood testing) but is a  food you eat without any issues.  This is not an allergy and we recommend keeping the food in the diet  Intolerance: this is when you have negative testing by either skin testing or blood testing thus not allergic but the food causes symptoms (like belly pain, bloating, diarrhea etc) with ingestion.  These foods should be avoided to prevent symptoms.     Environmental allergy -enviornmental allergy skin testing as above -allergen avoidance measures discussed/handouts provided -can use Zyrtec '10mg'$  daily as needed.  Other antihistamines you can use include Allegra '180mg'$  or Xyzal '5mg'$  daily as needed -for itchy, watery, red eyes can use OTC Pataday 1 drop each eye daily as needed -for nasal congestion/drainage can use nasal steroid spray like Flonase, Rhinocort, Nasacort 2 sprays each nostril daily for 1-2 weeks at a time before stopping once nasal congestion improves for maximum benefit  Follow-up in 3 months or sooner if needed  I appreciate the opportunity to take part in Bettyjean's care. Please do not hesitate to contact me with questions.  Sincerely,   Prudy Feeler, MD Allergy/Immunology [facility]

## 2020-09-11 LAB — COMPREHENSIVE METABOLIC PANEL
ALT: 36 IU/L — ABNORMAL HIGH (ref 0–32)
AST: 47 IU/L — ABNORMAL HIGH (ref 0–40)
Albumin/Globulin Ratio: 1.9 (ref 1.2–2.2)
Albumin: 4.7 g/dL (ref 3.8–4.8)
Alkaline Phosphatase: 92 IU/L (ref 44–121)
BUN/Creatinine Ratio: 16 (ref 9–23)
BUN: 11 mg/dL (ref 6–24)
Bilirubin Total: 0.2 mg/dL (ref 0.0–1.2)
CO2: 26 mmol/L (ref 20–29)
Calcium: 10.1 mg/dL (ref 8.7–10.2)
Chloride: 100 mmol/L (ref 96–106)
Creatinine, Ser: 0.69 mg/dL (ref 0.57–1.00)
Globulin, Total: 2.5 g/dL (ref 1.5–4.5)
Glucose: 95 mg/dL (ref 65–99)
Potassium: 3.8 mmol/L (ref 3.5–5.2)
Sodium: 139 mmol/L (ref 134–144)
Total Protein: 7.2 g/dL (ref 6.0–8.5)
eGFR: 109 mL/min/{1.73_m2} (ref >59)

## 2020-09-11 LAB — ALPHA-GAL PANEL
Allergen Lamb IgE: <0.1 kU/L
Beef IgE: 0.11 kU/L — AB
IgE (Immunoglobulin E), Serum: 1941 IU/mL — ABNORMAL HIGH (ref 6–495)
O215-IgE Alpha-Gal: <0.1 kU/L
Pork IgE: 0.11 kU/L — AB

## 2020-09-11 LAB — CBC WITH DIFFERENTIAL
Basophils Absolute: 0 10*3/uL (ref 0.0–0.2)
Basos: 1 %
EOS (ABSOLUTE): 0.1 10*3/uL (ref 0.0–0.4)
Eos: 3 %
Hematocrit: 39.6 % (ref 34.0–46.6)
Hemoglobin: 13.1 g/dL (ref 11.1–15.9)
Immature Grans (Abs): 0 10*3/uL (ref 0.0–0.1)
Immature Granulocytes: 0 %
Lymphocytes Absolute: 1.6 10*3/uL (ref 0.7–3.1)
Lymphs: 34 %
MCH: 29.1 pg (ref 26.6–33.0)
MCHC: 33.1 g/dL (ref 31.5–35.7)
MCV: 88 fL (ref 79–97)
Monocytes Absolute: 0.3 10*3/uL (ref 0.1–0.9)
Monocytes: 6 %
Neutrophils Absolute: 2.8 10*3/uL (ref 1.4–7.0)
Neutrophils: 56 %
RBC: 4.5 x10E6/uL (ref 3.77–5.28)
RDW: 14.3 % (ref 11.7–15.4)
WBC: 4.9 10*3/uL (ref 3.4–10.8)

## 2020-09-11 LAB — ALLERGEN, ORANGE F33: Orange: <0.1 kU/L

## 2020-09-11 LAB — CHRONIC URTICARIA: cu index: 1.4 (ref <10)

## 2020-09-11 LAB — TRYPTASE: Tryptase: 3.8 ug/L (ref 2.2–13.2)

## 2020-09-11 LAB — ALLERGEN, APPLE F49: Allergen Apple, IgE: <0.1 kU/L

## 2020-09-11 LAB — ALLERGEN, PINEAPPLE, F210: Pineapple IgE: <0.1 kU/L

## 2020-09-11 LAB — THYROID ANTIBODIES
Thyroglobulin Antibody: <1 IU/mL (ref 0.0–0.9)
Thyroperoxidase Ab SerPl-aCnc: <8 IU/mL (ref 0–34)

## 2020-09-11 LAB — ALLERGEN, GINGER, RF270: Allergen Ginger IgE: <0.1 kU/L

## 2020-09-14 ENCOUNTER — Other Ambulatory Visit: Payer: Self-pay | Admitting: *Deleted

## 2020-09-14 ENCOUNTER — Encounter: Payer: Self-pay | Admitting: *Deleted

## 2020-09-14 DIAGNOSIS — T783XXD Angioneurotic edema, subsequent encounter: Secondary | ICD-10-CM

## 2020-09-14 DIAGNOSIS — L508 Other urticaria: Secondary | ICD-10-CM

## 2020-09-14 MED ORDER — EPINEPHRINE 0.3 MG/0.3ML IJ SOAJ: 0.3000 mg | INTRAMUSCULAR | 1 refills | Status: AC | PRN

## 2020-10-20 ENCOUNTER — Ambulatory Visit: Payer: 59 | Admitting: Allergy and Immunology

## 2020-12-09 ENCOUNTER — Ambulatory Visit: Payer: 59 | Admitting: Allergy

## 2021-02-18 ENCOUNTER — Other Ambulatory Visit (HOSPITAL_COMMUNITY): Payer: Self-pay

## 2021-02-18 ENCOUNTER — Other Ambulatory Visit: Payer: Self-pay

## 2021-02-18 DIAGNOSIS — R1030 Lower abdominal pain, unspecified: Secondary | ICD-10-CM

## 2021-02-18 DIAGNOSIS — R195 Other fecal abnormalities: Secondary | ICD-10-CM

## 2021-03-01 ENCOUNTER — Other Ambulatory Visit: Payer: Self-pay | Admitting: Gastroenterology

## 2021-03-01 DIAGNOSIS — R195 Other fecal abnormalities: Secondary | ICD-10-CM

## 2021-03-01 DIAGNOSIS — R1030 Lower abdominal pain, unspecified: Secondary | ICD-10-CM

## 2021-03-02 ENCOUNTER — Ambulatory Visit (HOSPITAL_COMMUNITY): Admission: RE | Admit: 2021-03-02 | Payer: 59 | Source: Ambulatory Visit

## 2021-03-02 ENCOUNTER — Other Ambulatory Visit: Payer: Self-pay

## 2021-03-02 ENCOUNTER — Ambulatory Visit (HOSPITAL_COMMUNITY)
Admission: RE | Admit: 2021-03-02 | Discharge: 2021-03-02 | Disposition: A | Discharge status: Home / Self Care | Payer: 59 | Source: Ambulatory Visit | Attending: Physician Assistant | Admitting: Physician Assistant

## 2021-03-02 DIAGNOSIS — R1030 Lower abdominal pain, unspecified: Secondary | ICD-10-CM | POA: Insufficient documentation

## 2021-03-02 DIAGNOSIS — R195 Other fecal abnormalities: Secondary | ICD-10-CM | POA: Insufficient documentation

## 2021-03-02 MED ORDER — TECHNETIUM TC 99M SULFUR COLLOID
2.0000 | Freq: Once | INTRAVENOUS | Status: AC | PRN
  Administered 2021-03-02: 2 via ORAL

## 2021-08-13 ENCOUNTER — Emergency Department (HOSPITAL_BASED_OUTPATIENT_CLINIC_OR_DEPARTMENT_OTHER): Payer: 59

## 2021-08-13 ENCOUNTER — Other Ambulatory Visit: Payer: Self-pay

## 2021-08-13 ENCOUNTER — Encounter (HOSPITAL_BASED_OUTPATIENT_CLINIC_OR_DEPARTMENT_OTHER): Payer: Self-pay | Admitting: Emergency Medicine

## 2021-08-13 ENCOUNTER — Emergency Department (HOSPITAL_BASED_OUTPATIENT_CLINIC_OR_DEPARTMENT_OTHER)
Admission: EM | Admit: 2021-08-13 | Discharge: 2021-08-13 | Disposition: A | Discharge status: Home / Self Care | Payer: 59 | Attending: Emergency Medicine | Admitting: Emergency Medicine

## 2021-08-13 DIAGNOSIS — R109 Unspecified abdominal pain: Secondary | ICD-10-CM | POA: Diagnosis present

## 2021-08-13 DIAGNOSIS — R7401 Elevation of levels of liver transaminase levels: Secondary | ICD-10-CM | POA: Diagnosis not present

## 2021-08-13 DIAGNOSIS — K567 Ileus, unspecified: Secondary | ICD-10-CM | POA: Diagnosis not present

## 2021-08-13 DIAGNOSIS — R197 Diarrhea, unspecified: Secondary | ICD-10-CM | POA: Insufficient documentation

## 2021-08-13 DIAGNOSIS — R7989 Other specified abnormal findings of blood chemistry: Secondary | ICD-10-CM

## 2021-08-13 LAB — COMPREHENSIVE METABOLIC PANEL
ALT: 51 U/L — ABNORMAL HIGH (ref 0–44)
AST: 53 U/L — ABNORMAL HIGH (ref 15–41)
Albumin: 4.3 g/dL (ref 3.5–5.0)
Alkaline Phosphatase: 112 U/L (ref 38–126)
Anion gap: 12 (ref 5–15)
BUN: 9 mg/dL (ref 6–20)
CO2: 24 mmol/L (ref 22–32)
Calcium: 9.5 mg/dL (ref 8.9–10.3)
Chloride: 97 mmol/L — ABNORMAL LOW (ref 98–111)
Creatinine, Ser: 0.77 mg/dL (ref 0.44–1.00)
GFR, Estimated: >60 mL/min (ref >60)
Glucose, Bld: 131 mg/dL — ABNORMAL HIGH (ref 70–99)
Potassium: 3.2 mmol/L — ABNORMAL LOW (ref 3.5–5.1)
Sodium: 133 mmol/L — ABNORMAL LOW (ref 135–145)
Total Bilirubin: 1.4 mg/dL — ABNORMAL HIGH (ref 0.3–1.2)
Total Protein: 7.7 g/dL (ref 6.5–8.1)

## 2021-08-13 LAB — CBC WITH DIFFERENTIAL/PLATELET
Abs Immature Granulocytes: 0.02 10*3/uL (ref 0.00–0.07)
Basophils Absolute: 0 10*3/uL (ref 0.0–0.1)
Basophils Relative: 1 %
Eosinophils Absolute: 0.1 10*3/uL (ref 0.0–0.5)
Eosinophils Relative: 2 %
HCT: 38.1 % (ref 36.0–46.0)
Hemoglobin: 13.7 g/dL (ref 12.0–15.0)
Immature Granulocytes: 0 %
Lymphocytes Relative: 21 %
Lymphs Abs: 1.3 10*3/uL (ref 0.7–4.0)
MCH: 29.4 pg (ref 26.0–34.0)
MCHC: 36 g/dL (ref 30.0–36.0)
MCV: 81.8 fL (ref 80.0–100.0)
Monocytes Absolute: 0.4 10*3/uL (ref 0.1–1.0)
Monocytes Relative: 6 %
Neutro Abs: 4.5 10*3/uL (ref 1.7–7.7)
Neutrophils Relative %: 70 %
Platelets: 281 10*3/uL (ref 150–400)
RBC: 4.66 MIL/uL (ref 3.87–5.11)
RDW: 12.3 % (ref 11.5–15.5)
WBC: 6.4 10*3/uL (ref 4.0–10.5)
nRBC: 0 % (ref 0.0–0.2)

## 2021-08-13 LAB — URINALYSIS, ROUTINE W REFLEX MICROSCOPIC
Bilirubin Urine: NEGATIVE
Glucose, UA: NEGATIVE mg/dL
Hgb urine dipstick: NEGATIVE
Ketones, ur: NEGATIVE mg/dL
Leukocytes,Ua: NEGATIVE
Nitrite: NEGATIVE
Protein, ur: 30 mg/dL — AB
Specific Gravity, Urine: 1.02 (ref 1.005–1.030)
pH: 5.5 (ref 5.0–8.0)

## 2021-08-13 LAB — URINALYSIS, MICROSCOPIC (REFLEX)

## 2021-08-13 MED ORDER — ONDANSETRON 4 MG PO TBDP
4.0000 mg | ORAL_TABLET | Freq: Once | ORAL | Status: AC
  Administered 2021-08-13: 4 mg via ORAL

## 2021-08-13 MED ORDER — DICYCLOMINE HCL 10 MG/ML IM SOLN
20.0000 mg | Freq: Once | INTRAMUSCULAR | Status: AC
  Administered 2021-08-13: 20 mg via INTRAMUSCULAR
  Filled 2021-08-13: qty 2

## 2021-08-13 MED ORDER — ALUM & MAG HYDROXIDE-SIMETH 200-200-20 MG/5ML PO SUSP
30.0000 mL | Freq: Once | ORAL | Status: AC
  Administered 2021-08-13: 30 mL via ORAL
  Filled 2021-08-13: qty 30

## 2021-08-13 MED ORDER — ONDANSETRON HCL 4 MG/2ML IJ SOLN: 4.0000 mg | Freq: Once | INTRAMUSCULAR | Status: DC

## 2021-08-13 MED ORDER — KETOROLAC TROMETHAMINE 60 MG/2ML IM SOLN
60.0000 mg | Freq: Once | INTRAMUSCULAR | Status: AC
  Administered 2021-08-13: 60 mg via INTRAMUSCULAR
  Filled 2021-08-13: qty 2

## 2021-08-13 MED ORDER — DICYCLOMINE HCL 20 MG PO TABS: 20.0000 mg | ORAL_TABLET | Freq: Two times a day (BID) | ORAL | 0 refills | Status: DC

## 2021-08-13 MED ORDER — ONDANSETRON 4 MG PO TBDP
ORAL_TABLET | ORAL | Status: AC
  Filled 2021-08-13: qty 1

## 2021-08-13 NOTE — ED Notes (Signed)
Rx x 1 given  Written and verbal inst to pt  Verbalized an understanding  To home with family  

## 2021-08-13 NOTE — ED Provider Notes (Signed)
[facility]PART[facility] Provider Note   CSN: [id number] Arrival date & time: 08/13/21  0309     History  Chief Complaint  Patient presents with   Abdominal Pain    Judith [name] is a 46 y.o. female.  The history is provided by the patient.  Abdominal Pain Pain quality: cramping   Pain radiates to:  Does not radiate Pain severity:  Severe Onset quality:  Gradual Duration:  13 hours Timing:  Constant Progression:  Waxing and waning Chronicity:  New Context: not eating, not laxative use and not trauma   Relieved by:  Nothing Worsened by:  Nothing Ineffective treatments:  None tried Associated symptoms: diarrhea   Associated symptoms: no fever and no vomiting   Associated symptoms comment:  Has diarrhea and ongoing nausea. Patient is being evaluated for IBS by GI Risk factors: no NSAID use        Home Medications Prior to Admission medications   Medication Sig Start Date End Date Taking? Authorizing Provider  dicyclomine (BENTYL) 20 MG tablet Take 1 tablet (20 mg total) by mouth 2 (two) times daily. 08/13/21  Yes Tonja Jezewski, MD  atorvastatin (LIPITOR) 20 MG tablet Take 20 mg by mouth daily. 08/04/20   [provider]  EPINEPHrine (EPIPEN 2-PAK) 0.3 mg/0.3 mL IJ SOAJ injection Inject 0.3 mg into the muscle as needed for anaphylaxis. 09/14/20   Kennith Gain, MD  levothyroxine (SYNTHROID) 50 MCG tablet Take 50 mcg by mouth daily. 08/06/20   [provider]  pantoprazole (PROTONIX) 40 MG tablet Take by mouth.    [provider]  Potassium Chloride ER 20 MEQ TBCR Take 1 tablet by mouth daily. 09/03/20   [provider]  triamterene-hydrochlorothiazide (DYAZIDE) 37.5-25 MG capsule Take 1 capsule by mouth daily. HCTZ    [provider]      Allergies    Ambien [zolpidem]    Review of Systems   Review of Systems  Constitutional:  Negative for fever.  HENT:  Negative for facial swelling.    Gastrointestinal:  Positive for abdominal pain and diarrhea. Negative for vomiting.  All other systems reviewed and are negative.   Physical Exam Updated Vital Signs BP (!) 141/100   Pulse (!) 106   Temp 98 F (36.7 C) (Oral)   Resp 18   Ht '5\' 3"'$  (1.6 m)   Wt 81.6 kg   SpO2 98%   BMI 31.89 kg/m  Physical Exam Vitals and nursing note reviewed.  Constitutional:      General: She is not in acute distress.    Appearance: Normal appearance. She is well-developed.  HENT:     Head: Normocephalic and atraumatic.     Nose: Nose normal.  Eyes:     Pupils: Pupils are equal, round, and reactive to light.  Cardiovascular:     Rate and Rhythm: Normal rate and regular rhythm.     Pulses: Normal pulses.     Heart sounds: Normal heart sounds.  Pulmonary:     Effort: No respiratory distress.     Breath sounds: Normal breath sounds.  Abdominal:     General: Abdomen is flat. Bowel sounds are increased. There is no distension.     Palpations: Abdomen is soft.     Tenderness: There is no abdominal tenderness. There is no guarding or rebound. Negative signs include Murphy's sign and McBurney's sign.     Comments: Gassy throughout   Genitourinary:    Vagina: No vaginal discharge.  Musculoskeletal:        General: Normal range of motion.     Cervical back: Neck supple.  Skin:    General: Skin is warm and dry.     Capillary Refill: Capillary refill takes less than 2 seconds.     Findings: No erythema or rash.  Neurological:     General: No focal deficit present.     Mental Status: She is alert and oriented to person, place, and time.     Deep Tendon Reflexes: Reflexes normal.  Psychiatric:        Mood and Affect: Mood normal.        Behavior: Behavior normal.     ED Results / Procedures / Treatments   Labs (all labs ordered are listed, but only abnormal results are displayed) Results for orders placed or performed during the hospital encounter of 08/13/21  CBC with Differential   Result Value Ref Range   WBC 6.4 4.0 - 10.5 K/uL   RBC 4.66 3.87 - 5.11 MIL/uL   Hemoglobin 13.7 12.0 - 15.0 g/dL   HCT 38.1 36.0 - 46.0 %   MCV 81.8 80.0 - 100.0 fL   MCH 29.4 26.0 - 34.0 pg   MCHC 36.0 30.0 - 36.0 g/dL   RDW 12.3 11.5 - 15.5 %   Platelets 281 150 - 400 K/uL   nRBC 0.0 0.0 - 0.2 %   Neutrophils Relative % 70 %   Neutro Abs 4.5 1.7 - 7.7 K/uL   Lymphocytes Relative 21 %   Lymphs Abs 1.3 0.7 - 4.0 K/uL   Monocytes Relative 6 %   Monocytes Absolute 0.4 0.1 - 1.0 K/uL   Eosinophils Relative 2 %   Eosinophils Absolute 0.1 0.0 - 0.5 K/uL   Basophils Relative 1 %   Basophils Absolute 0.0 0.0 - 0.1 K/uL   Immature Granulocytes 0 %   Abs Immature Granulocytes 0.02 0.00 - 0.07 K/uL  Comprehensive metabolic panel  Result Value Ref Range   Sodium 133 (L) 135 - 145 mmol/L   Potassium 3.2 (L) 3.5 - 5.1 mmol/L   Chloride 97 (L) 98 - 111 mmol/L   CO2 24 22 - 32 mmol/L   Glucose, Bld 131 (H) 70 - 99 mg/dL   BUN 9 6 - 20 mg/dL   Creatinine, Ser 0.77 0.44 - 1.00 mg/dL   Calcium 9.5 8.9 - 10.3 mg/dL   Total Protein 7.7 6.5 - 8.1 g/dL   Albumin 4.3 3.5 - 5.0 g/dL   AST 53 (H) 15 - 41 U/L   ALT 51 (H) 0 - 44 U/L   Alkaline Phosphatase 112 38 - 126 U/L   Total Bilirubin 1.4 (H) 0.3 - 1.2 mg/dL   GFR, Estimated >60 >60 mL/min   Anion gap 12 5 - 15  Urinalysis, Routine w reflex microscopic Urine, Clean Catch  Result Value Ref Range   Color, Urine YELLOW YELLOW   APPearance HAZY (A) CLEAR   Specific Gravity, Urine 1.020 1.005 - 1.030   pH 5.5 5.0 - 8.0   Glucose, UA NEGATIVE NEGATIVE mg/dL   Hgb urine dipstick NEGATIVE NEGATIVE   Bilirubin Urine NEGATIVE NEGATIVE   Ketones, ur NEGATIVE NEGATIVE mg/dL   Protein, ur 30 (A) NEGATIVE mg/dL   Nitrite NEGATIVE NEGATIVE   Leukocytes,Ua NEGATIVE NEGATIVE  Urinalysis, Microscopic (reflex)  Result Value Ref Range   RBC / HPF 0-5 0 - 5 RBC/hpf   WBC, UA 0-5 0 - 5 WBC/hpf   Bacteria, UA FEW (  A) NONE SEEN   Squamous Epithelial  / LPF 0-5 0 - 5   CT Renal Stone Study  Result Date: 08/13/2021 CLINICAL DATA:  46 year old female with flank pain. EXAM: CT ABDOMEN AND PELVIS WITHOUT CONTRAST TECHNIQUE: Multidetector CT imaging of the abdomen and pelvis was performed following the standard protocol without IV contrast. RADIATION DOSE REDUCTION: This exam was performed according to the departmental dose-optimization program which includes automated exposure control, adjustment of the mA and/or kV according to patient size and/or use of iterative reconstruction technique. COMPARISON:  CTA chest 03/11/2009. FINDINGS: Lower chest: Mild chronic scarring in the right middle lobe, otherwise negative. Hepatobiliary: Negative noncontrast liver and gallbladder. Pancreas: Negative. Spleen: Negative.  Splenule, normal variant. Adrenals/Urinary Tract: Normal adrenal glands. Noncontrast kidneys appears symmetric and normal. No nephrolithiasis, hydronephrosis, or pararenal inflammation. Both ureters are decompressed to the bladder. Occasional pelvic phleboliths. Stomach/Bowel: Fluid containing small and large bowel loops throughout the abdomen. Fluid in the large bowel to the level of the sigmoid colon. No dilated large bowel, and the appendix remains normal on series 2, image 56. Decompressed stomach and duodenum, proximal jejunum. But fluid-filled mid small bowel loops are mildly dilated in the abdomen and pelvis up to nearly 3 cm diameter. However, there is a gradual transition to decompressed terminal ileum. There is a small volume of free fluid in the distal small bowel mesentery. No free air. Vascular/Lymphatic: Normal caliber abdominal aorta with mild calcified atherosclerosis. No lymphadenopathy identified. Reproductive: Absent uterus.  Ovaries are within normal limits. Other: Small volume of free fluid in the pelvis with simple fluid density (series 2, image 73). Musculoskeletal: No acute osseous abnormality identified. Some lower lumbar spine  degeneration including mild retrolisthesis of L5 on S1. IMPRESSION: 1. Fluid-filled small and large bowel throughout the abdomen and pelvis suggesting Enteritis/diarrhea. Mid small bowel loops are mildly dilated, but there is a gradual transition to more normal caliber terminal ileum. Favor ileus rather than mechanical SBO. Small volume of free fluid in the abdomen and pelvis.  No free air. 2. Appendix remains normal. No urinary calculus, obstructive uropathy, or other acute finding. Electronically Signed   By: Genevie Ann M.D.   On: 08/13/2021 04:23      Radiology CT Renal Stone Study  Result Date: 08/13/2021 CLINICAL DATA:  46 year old female with flank pain. EXAM: CT ABDOMEN AND PELVIS WITHOUT CONTRAST TECHNIQUE: Multidetector CT imaging of the abdomen and pelvis was performed following the standard protocol without IV contrast. RADIATION DOSE REDUCTION: This exam was performed according to the departmental dose-optimization program which includes automated exposure control, adjustment of the mA and/or kV according to patient size and/or use of iterative reconstruction technique. COMPARISON:  CTA chest 03/11/2009. FINDINGS: Lower chest: Mild chronic scarring in the right middle lobe, otherwise negative. Hepatobiliary: Negative noncontrast liver and gallbladder. Pancreas: Negative. Spleen: Negative.  Splenule, normal variant. Adrenals/Urinary Tract: Normal adrenal glands. Noncontrast kidneys appears symmetric and normal. No nephrolithiasis, hydronephrosis, or pararenal inflammation. Both ureters are decompressed to the bladder. Occasional pelvic phleboliths. Stomach/Bowel: Fluid containing small and large bowel loops throughout the abdomen. Fluid in the large bowel to the level of the sigmoid colon. No dilated large bowel, and the appendix remains normal on series 2, image 56. Decompressed stomach and duodenum, proximal jejunum. But fluid-filled mid small bowel loops are mildly dilated in the abdomen and pelvis  up to nearly 3 cm diameter. However, there is a gradual transition to decompressed terminal ileum. There is a small volume of free fluid in  the distal small bowel mesentery. No free air. Vascular/Lymphatic: Normal caliber abdominal aorta with mild calcified atherosclerosis. No lymphadenopathy identified. Reproductive: Absent uterus.  Ovaries are within normal limits. Other: Small volume of free fluid in the pelvis with simple fluid density (series 2, image 73). Musculoskeletal: No acute osseous abnormality identified. Some lower lumbar spine degeneration including mild retrolisthesis of L5 on S1. IMPRESSION: 1. Fluid-filled small and large bowel throughout the abdomen and pelvis suggesting Enteritis/diarrhea. Mid small bowel loops are mildly dilated, but there is a gradual transition to more normal caliber terminal ileum. Favor ileus rather than mechanical SBO. Small volume of free fluid in the abdomen and pelvis.  No free air. 2. Appendix remains normal. No urinary calculus, obstructive uropathy, or other acute finding. Electronically Signed   By: Genevie Ann M.D.   On: 08/13/2021 04:23    Procedures Procedures    Medications Ordered in ED Medications  alum & mag hydroxide-simeth (MAALOX/MYLANTA) 200-200-20 MG/5ML suspension 30 mL (30 mLs Oral Given 08/13/21 0352)  dicyclomine (BENTYL) injection 20 mg (20 mg Intramuscular Given 08/13/21 0355)  ondansetron (ZOFRAN-ODT) disintegrating tablet 4 mg (4 mg Oral Given 08/13/21 0355)  ketorolac (TORADOL) injection 60 mg (60 mg Intramuscular Given 08/13/21 0430)    ED Course/ Medical Decision Making/ A&P                           Medical Decision Making Patient with abdominal pain coming in waves since 4 pm.  Feels like gas but gas X did not help   Problems Addressed: Diarrhea, unspecified type: chronic illness or injury    Details: likely related to IBS, undergoing work up with GI Elevated liver function tests:    Details: Patient had this in the past  accodring to records in [facility], negative [place] and CT.  Patient reports she is pre diabetic.  I do not believe this is biliary colic, exam is benign as reassuring [facility]):    Details: is passing gas and tolerate PO continue to hydrate at home.  Amount and/or Complexity of Data Reviewed Independent Historian: spouse    Details: see above Labs: ordered.    Details: all labs reviewed by me:  Normal white count 6.4 with normal hemoglobin and platelet count.  Sodium 133, slightly low, potassium 3.2.  Normal BUN and creatinine.  Elevated AST 53 and ALT 51 with bilirubin 1.4 AST and ALT were also elevated in July of 2022.  Urine without UTI. Radiology: ordered and independent interpretation performed.    Details: no stones no mechanical obstruction by me.  GB without stones  Risk OTC drugs. Prescription drug management. Risk Details: I do not believe this is biliary colic at this time.  Exam is benign and reassuring. No Stones seen on CT scan.  No elevation of white count.  LFTs were elevated last year and patient reports that she is pre diabetic which can increase LFT and be associated with fatty liver disease which raises LFTs.  I have advised follow up with PMD for recheck and ongoing evaluation.  Patient verbalizes understanding.  I believe her pain and diarrhea are likely IBS, patient is currently being evaluated for this condition but is not on any medication.  I will start bentyl and have advised bland diet and close follow up with GI for ongoing care.  Patient has zofran at home.      Final Clinical Impression(s) / ED Diagnoses Final diagnoses:  Ileus ([place])  Diarrhea,  unspecified type  Elevated liver function tests   Return for intractable cough, coughing up blood, fevers > 100.4 unrelieved by medication, shortness of breath, intractable vomiting, chest pain, shortness of breath, weakness, numbness, changes in speech, facial asymmetry, abdominal pain, passing out, Inability to tolerate  liquids or food, cough, altered mental status or any concerns. No signs of systemic illness or infection. The patient is nontoxic-appearing on exam and vital signs are within normal limits.  I have reviewed the triage vital signs and the nursing notes. Pertinent labs & imaging results that were available during my care of the patient were reviewed by me and considered in my medical decision making (see chart for details). After history, exam, and medical workup I feel the patient has been appropriately medically screened and is safe for discharge home. Pertinent diagnoses were discussed with the patient. Patient was given return precautions.  Rx / DC Orders ED Discharge Orders          Ordered    dicyclomine (BENTYL) 20 MG tablet  2 times daily        08/13/21 0427              Tamim Skog, MD 08/13/21 9675

## 2021-08-13 NOTE — ED Notes (Signed)
ED Provider at bedside. 

## 2021-08-13 NOTE — ED Notes (Signed)
Pt returned from CT °

## 2021-08-13 NOTE — ED Triage Notes (Signed)
Pt c/o pain across upper abd described as sharp and comes in waves. Denis n/v/d

## 2021-08-13 NOTE — ED Notes (Signed)
Pt given water 

## 2022-03-29 ENCOUNTER — Other Ambulatory Visit (HOSPITAL_BASED_OUTPATIENT_CLINIC_OR_DEPARTMENT_OTHER): Payer: Self-pay

## 2022-03-29 ENCOUNTER — Other Ambulatory Visit: Payer: Self-pay

## 2022-03-29 ENCOUNTER — Emergency Department (HOSPITAL_BASED_OUTPATIENT_CLINIC_OR_DEPARTMENT_OTHER)
Admission: EM | Admit: 2022-03-29 | Discharge: 2022-03-29 | Disposition: A | Discharge status: Home / Self Care | Payer: 59 | Attending: Emergency Medicine | Admitting: Emergency Medicine

## 2022-03-29 ENCOUNTER — Encounter (HOSPITAL_BASED_OUTPATIENT_CLINIC_OR_DEPARTMENT_OTHER): Payer: Self-pay | Admitting: Emergency Medicine

## 2022-03-29 DIAGNOSIS — D709 Neutropenia, unspecified: Secondary | ICD-10-CM | POA: Insufficient documentation

## 2022-03-29 DIAGNOSIS — M542 Cervicalgia: Secondary | ICD-10-CM | POA: Diagnosis present

## 2022-03-29 DIAGNOSIS — J101 Influenza due to other identified influenza virus with other respiratory manifestations: Secondary | ICD-10-CM | POA: Insufficient documentation

## 2022-03-29 DIAGNOSIS — Z1152 Encounter for screening for COVID-19: Secondary | ICD-10-CM | POA: Diagnosis not present

## 2022-03-29 DIAGNOSIS — E876 Hypokalemia: Secondary | ICD-10-CM | POA: Diagnosis not present

## 2022-03-29 DIAGNOSIS — J111 Influenza due to unidentified influenza virus with other respiratory manifestations: Secondary | ICD-10-CM

## 2022-03-29 LAB — CBC WITH DIFFERENTIAL/PLATELET
Abs Immature Granulocytes: 0.01 10*3/uL (ref 0.00–0.07)
Basophils Absolute: 0 10*3/uL (ref 0.0–0.1)
Basophils Relative: 1 %
Eosinophils Absolute: 0 10*3/uL (ref 0.0–0.5)
Eosinophils Relative: 2 %
HCT: 35.4 % — ABNORMAL LOW (ref 36.0–46.0)
Hemoglobin: 12.2 g/dL (ref 12.0–15.0)
Immature Granulocytes: 1 %
Lymphocytes Relative: 28 %
Lymphs Abs: 0.5 10*3/uL — ABNORMAL LOW (ref 0.7–4.0)
MCH: 30 pg (ref 26.0–34.0)
MCHC: 34.5 g/dL (ref 30.0–36.0)
MCV: 87 fL (ref 80.0–100.0)
Monocytes Absolute: 0.4 10*3/uL (ref 0.1–1.0)
Monocytes Relative: 22 %
Neutro Abs: 0.9 10*3/uL — ABNORMAL LOW (ref 1.7–7.7)
Neutrophils Relative %: 46 %
Platelets: 175 10*3/uL (ref 150–400)
RBC: 4.07 MIL/uL (ref 3.87–5.11)
RDW: 13.2 % (ref 11.5–15.5)
WBC: 1.9 10*3/uL — ABNORMAL LOW (ref 4.0–10.5)
nRBC: 0 % (ref 0.0–0.2)

## 2022-03-29 LAB — RESP PANEL BY RT-PCR (RSV, FLU A&B, COVID)  RVPGX2
Influenza A by PCR: NEGATIVE
Influenza B by PCR: POSITIVE — AB
Resp Syncytial Virus by PCR: NEGATIVE
SARS Coronavirus 2 by RT PCR: NEGATIVE

## 2022-03-29 LAB — BASIC METABOLIC PANEL
Anion gap: 9 (ref 5–15)
BUN: 9 mg/dL (ref 6–20)
CO2: 26 mmol/L (ref 22–32)
Calcium: 8.6 mg/dL — ABNORMAL LOW (ref 8.9–10.3)
Chloride: 100 mmol/L (ref 98–111)
Creatinine, Ser: 0.7 mg/dL (ref 0.44–1.00)
GFR, Estimated: >60 mL/min (ref >60)
Glucose, Bld: 119 mg/dL — ABNORMAL HIGH (ref 70–99)
Potassium: 2.9 mmol/L — ABNORMAL LOW (ref 3.5–5.1)
Sodium: 135 mmol/L (ref 135–145)

## 2022-03-29 LAB — MAGNESIUM: Magnesium: 1.7 mg/dL (ref 1.7–2.4)

## 2022-03-29 MED ORDER — BENZONATATE 100 MG PO CAPS
100.0000 mg | ORAL_CAPSULE | Freq: Once | ORAL | Status: AC
  Administered 2022-03-29: 100 mg via ORAL
  Filled 2022-03-29: qty 1

## 2022-03-29 MED ORDER — POTASSIUM CHLORIDE CRYS ER 20 MEQ PO TBCR
60.0000 meq | EXTENDED_RELEASE_TABLET | Freq: Once | ORAL | Status: AC
  Administered 2022-03-29: 60 meq via ORAL
  Filled 2022-03-29: qty 3

## 2022-03-29 MED ORDER — ACETAMINOPHEN 500 MG PO TABS
500.0000 mg | ORAL_TABLET | Freq: Four times a day (QID) | ORAL | 0 refills | Status: AC | PRN
  Filled 2022-03-29: qty 100, 25d supply, fill #0

## 2022-03-29 MED ORDER — KETOROLAC TROMETHAMINE 15 MG/ML IJ SOLN
15.0000 mg | Freq: Once | INTRAMUSCULAR | Status: AC
Start: 2022-03-29 — End: 2022-03-29
  Administered 2022-03-29: 15 mg via INTRAVENOUS
  Filled 2022-03-29: qty 1

## 2022-03-29 MED ORDER — BENZONATATE 100 MG PO CAPS: 100.0000 mg | ORAL_CAPSULE | Freq: Three times a day (TID) | ORAL | 0 refills | Status: DC

## 2022-03-29 MED ORDER — ONDANSETRON HCL 4 MG PO TABS
4.0000 mg | ORAL_TABLET | Freq: Four times a day (QID) | ORAL | 0 refills | Status: AC
  Filled 2022-03-29 (×2): qty 12, 3d supply, fill #0

## 2022-03-29 MED ORDER — SODIUM CHLORIDE 0.9 % IV BOLUS
1000.0000 mL | Freq: Once | INTRAVENOUS | Status: AC
  Administered 2022-03-29: 1000 mL via INTRAVENOUS

## 2022-03-29 MED ORDER — KETOROLAC TROMETHAMINE 15 MG/ML IJ SOLN: 15.0000 mg | Freq: Once | INTRAMUSCULAR | Status: DC

## 2022-03-29 MED ORDER — ONDANSETRON HCL 4 MG PO TABS: 4.0000 mg | ORAL_TABLET | Freq: Four times a day (QID) | ORAL | 0 refills | Status: DC

## 2022-03-29 MED ORDER — BENZONATATE 100 MG PO CAPS
100.0000 mg | ORAL_CAPSULE | Freq: Three times a day (TID) | ORAL | 0 refills | Status: DC
  Filled 2022-03-29 (×2): qty 21, 7d supply, fill #0

## 2022-03-29 MED ORDER — ACETAMINOPHEN 500 MG PO TABS: 500.0000 mg | ORAL_TABLET | Freq: Four times a day (QID) | ORAL | 0 refills | Status: DC | PRN

## 2022-03-29 NOTE — ED Provider Notes (Signed)
[facility] Provider Note   CSN: [id number] Arrival date & time: 03/29/22  1019     History  Chief Complaint  Patient presents with   Neck Pain    Judith [name] is a 47 y.o. female presenting today with bodyaches, nausea and vomiting.  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  A week later she started to take the steroids because her neck pain was not getting worse.  The day after the last dose of steroids she started to feel very severe muscular and joint pain.  A couple days after that she started to become nauseous and experienced vomiting and diarrhea.  She is now complaining of severe body aches and joint pain that is not being relieved with Tylenol.  Also requesting GI referral   Neck Pain      Home Medications Prior to Admission medications   Medication Sig Start Date End Date Taking? Authorizing Provider  atorvastatin (LIPITOR) 20 MG tablet Take 20 mg by mouth daily. 08/04/20   [provider]  dicyclomine (BENTYL) 20 MG tablet Take 1 tablet (20 mg total) by mouth 2 (two) times daily. 08/13/21   Palumbo, April, MD  EPINEPHrine (EPIPEN 2-PAK) 0.3 mg/0.3 mL IJ SOAJ injection Inject 0.3 mg into the muscle as needed for anaphylaxis. 09/14/20   Kennith Gain, MD  levothyroxine (SYNTHROID) 50 MCG tablet Take 50 mcg by mouth daily. 08/06/20   [provider]  pantoprazole (PROTONIX) 40 MG tablet Take by mouth.    [provider]  Potassium Chloride ER 20 MEQ TBCR Take 1 tablet by mouth daily. 09/03/20   [provider]  triamterene-hydrochlorothiazide (DYAZIDE) 37.5-25 MG capsule Take 1 capsule by mouth daily. HCTZ    [provider]      Allergies    Ambien [zolpidem]    Review of Systems   Review of Systems   Musculoskeletal:  Positive for neck pain.    Physical Exam Updated Vital Signs BP (!) 143/96 (BP Location: Right Arm)   Pulse (!) 103   Temp 98.3 F (36.8 C) (Oral)   Resp 17   Ht 5' 3"$  (1.6 m)   Wt 82.6 kg   SpO2 100%   BMI 32.24 kg/m  Physical Exam Vitals and nursing note reviewed.  Constitutional:      General: She is not in acute distress.    Appearance: Normal appearance. She is not ill-appearing.  HENT:     Head: Normocephalic and atraumatic.     Nose: Rhinorrhea present.     Mouth/Throat:     Mouth: Mucous membranes are moist.     Pharynx: Oropharynx is clear.  Eyes:     General: No scleral icterus.    Conjunctiva/sclera: Conjunctivae normal.  Cardiovascular:     Rate and Rhythm: Normal rate and regular rhythm.  Pulmonary:     Effort: Pulmonary effort is normal. No respiratory distress.     Breath sounds: No wheezing.  Musculoskeletal:        General: Normal range of motion.     Cervical back: Tenderness present. No rigidity.  Skin:    General: Skin is warm and dry.     Findings: No rash.  Neurological:     Mental Status: She is alert.  Psychiatric:  Mood and Affect: Mood normal.     ED Results / Procedures / Treatments   Labs (all labs ordered are listed, but only abnormal results are displayed) Labs Reviewed  RESP PANEL BY RT-PCR (RSV, FLU A&B, COVID)  RVPGX2 - Abnormal; Notable for the following components:      Result Value   Influenza B by PCR POSITIVE (*)    All other components within normal limits  CBC WITH DIFFERENTIAL/PLATELET - Abnormal; Notable for the following components:   WBC 1.9 (*)    HCT 35.4 (*)    Neutro Abs 0.9 (*)    Lymphs Abs 0.5 (*)    All other components within normal limits  BASIC METABOLIC PANEL - Abnormal; Notable for the following components:   Potassium 2.9 (*)    Glucose, Bld 119 (*)    Calcium 8.6 (*)    All other components within normal limits  MAGNESIUM  PATHOLOGIST SMEAR REVIEW     EKG None  Radiology No results found.  Procedures Procedures   Medications Ordered in ED Medications  potassium chloride SA (KLOR-CON M) CR tablet 60 mEq (60 mEq Oral Given 03/29/22 1326)  benzonatate (TESSALON) capsule 100 mg (100 mg Oral Given 03/29/22 1423)  ketorolac (TORADOL) 15 MG/ML injection 15 mg (15 mg Intravenous Given 03/29/22 1424)  sodium chloride 0.9 % bolus 1,000 mL (0 mLs Intravenous Stopped 03/29/22 1534)    ED Course/ Medical Decision Making/ A&P                             Medical Decision Making Amount and/or Complexity of Data Reviewed Labs: ordered.  Risk OTC drugs. Prescription drug management.   Additional history: Per chart review patient has never had abnormal WBC count   Physical Exam: Pertinent physical exam findings include Rhinorrhea, erythematous throat without exudate Lung sounds RRR Normal radial pulses bilaterally  Lab Tests: I ordered, and personally interpreted labs.  The pertinent results include: WBC 1.9, neutropenia, very mild lymphopenia   Imaging Studies: Lung sounds clear, do not believe she needs a chest x-ray.  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    Medications: I ordered medication including fluids, Toradol, potassium and Tessalon. Reevaluation of the patient after these medicines showed that the patient improved.   Consultations Obtained: I spoke with Dr. Irene Limbo with hematology and he recommends that the patient take a B complex vitamin and have labs redrawn with PCP in 2 to 3 weeks.  If WBC is still low, patient will be able to follow-up with him.  MDM/Disposition: This is a 47 year old female presenting with bodyaches, weakness, dry cough and other URI symptoms for the past week.  Tested positive for the flu today.  Also was found to have a potassium of 2.9, likely secondary to vomiting and diarrhea that occurred over the weekend.  She is now tolerating  p.o.  She was given oral potassium and instructed to start an over-the-counter supplement.  Additionally, she was informed of hematology recommendations.  Airway is clear and patient is tolerating secretions.  Doubt any emergent condition causing her sore throat.  Lung sounds are clear, do not believe she needs any further imaging.  Suspect she will start to feel better as the flu runs its course.  Leukopenia may be secondary to her viral illness as well.  Strict return precautions were given and patient reported feeling much better after Toradol, fluids,  benzonatate and potassium.  Will be discharged at this time  Final Clinical Impression(s) / ED Diagnoses Final diagnoses:  Flu  Hypokalemia  Neutropenia, unspecified type [facility])    Rx / DC Orders ED Discharge Orders          Ordered    Ambulatory referral to Gastroenterology       Comments: Patient would like new GI doctor due to upper GI problems   03/29/22 1334           Results and diagnoses were explained to the patient. Return precautions discussed in full. Patient had no additional questions and expressed complete understanding.   This chart was dictated using voice recognition software.  Despite best efforts to proofread,  errors can occur which can change the documentation meaning.    Rhae Hammock, PA-C 03/29/22 1639    Gareth Morgan, MD 03/31/22 1627

## 2022-03-29 NOTE — Discharge Instructions (Addendum)
You came to the emergency department with bodyaches, weakness and a dry cough.  You tested positive for the flu and your potassium was very low.  Please start taking an outpatient potassium supplement, you can buy various ones over-the-counter.  Additionally, as we discussed your white blood cell count is very low.  I spoke with Dr. Irene Limbo with hematology who recommends that you take a B complex vitamin for the next month.  In 2 to 3 weeks he would like you to see your primary to have your lab work redrawn.  They can recheck your potassium as well.  You may take Tylenol for your body aches and I have sent the medications for cough and nausea to the pharmacy.  Do not hesitate to return with any worsening or recurrent symptoms.

## 2022-03-29 NOTE — ED Triage Notes (Signed)
Pt here for sore throat, cough, fever, HA, body aches x1 week. Pt has hx of neck pain and received a steroid shot approx 1 month ago, pt states it helped for 1.5 weeks, then pt was put on prednisone. Pt has tried tylenol but pt still has sharp pains in L temple, L arm and L knee.

## 2022-03-29 NOTE — ED Notes (Signed)
Pt. Reports having neck pain and feeling tired and feeling muscle spasms in her neck.  Pt. In no distress with just feeling tired.

## 2022-03-31 LAB — PATHOLOGIST SMEAR REVIEW

## 2022-04-22 ENCOUNTER — Other Ambulatory Visit (HOSPITAL_BASED_OUTPATIENT_CLINIC_OR_DEPARTMENT_OTHER): Payer: Self-pay | Admitting: Family Medicine

## 2022-04-22 DIAGNOSIS — H532 Diplopia: Secondary | ICD-10-CM

## 2022-04-30 ENCOUNTER — Ambulatory Visit (HOSPITAL_BASED_OUTPATIENT_CLINIC_OR_DEPARTMENT_OTHER)
Admission: RE | Admit: 2022-04-30 | Discharge: 2022-04-30 | Disposition: A | Discharge status: Home / Self Care | Payer: 59 | Source: Ambulatory Visit | Attending: Family Medicine | Admitting: Family Medicine

## 2022-04-30 DIAGNOSIS — H532 Diplopia: Secondary | ICD-10-CM | POA: Diagnosis present

## 2022-04-30 MED ORDER — GADOBUTROL 1 MMOL/ML IV SOLN
8.2000 mL | Freq: Once | INTRAVENOUS | Status: AC | PRN
  Administered 2022-04-30: 8.2 mL via INTRAVENOUS

## 2022-05-03 ENCOUNTER — Other Ambulatory Visit (HOSPITAL_COMMUNITY): Payer: Self-pay

## 2022-06-20 ENCOUNTER — Telehealth: Payer: Self-pay | Admitting: Neurology

## 2022-06-20 ENCOUNTER — Encounter: Payer: Self-pay | Admitting: Neurology

## 2022-06-20 ENCOUNTER — Ambulatory Visit: Payer: 59 | Admitting: Neurology

## 2022-06-20 VITALS — BP 149/96 | HR 92 | Ht 65.0 in | Wt 177.0 lb

## 2022-06-20 DIAGNOSIS — R4189 Other symptoms and signs involving cognitive functions and awareness: Secondary | ICD-10-CM | POA: Insufficient documentation

## 2022-06-20 DIAGNOSIS — G4730 Sleep apnea, unspecified: Secondary | ICD-10-CM | POA: Insufficient documentation

## 2022-06-20 NOTE — Progress Notes (Signed)
Chief Complaint  Patient presents with   New Patient (Initial Visit)    Rm 15. Coplains of memory changes and blurred vision      ASSESSMENT AND PLAN  Judith [name] is a 47 y.o. female   Cognitive impairment in young At risk for obstructive sleep apnea  MoCA examination 30/30  MRI of the brain was normal  Hypothyroidism but not compliant with her thyroid supplement  Laboratory evaluation in February showed multiple abnormalities, will repeat laboratory evaluations, to rule out metabolic disarrangement, thyroid malfunction, infectious, inflammatory process, her mood disorder likely play a role as well  EEG  Referral to sleep study  DIAGNOSTIC DATA (LABS, IMAGING, TESTING) - I reviewed patient records, labs, notes, testing and imaging myself where available.   MEDICAL HISTORY:  Judith [name] is a 47 year old female, seen in request by her primary care physician Dr. Garth Bigness for evaluation of cognitive impairment, initial evaluation was on Jun 20, 2022  I reviewed and summarized the referring note. PMHX. HTN Hypothyroidism GERD HLD Anxiety  She had long history of anxiety, works as a necrotic Producer, television/film/video, investigating cartel, drug dealer, working out hours depend on her job's need  Since 2023, she noticed intermittent cognitive malfunction, tends to repeat questions, has to pay close attention to be able to follow the conversation of her coworker, she also described visual hallucinations, while watching TV with her husband, she saw gray object running across the floor, which was not witnessed by her husband, also gave examples of see something coming from her side, she has to dodge it.  She had long history of excessive sleepiness, over the past weekend, she spent 80% of her time sleeping, has loud snoring, frequent awakening, sometimes wake herself catching her breath  Laboratory evaluation in February 2024, BMP, potassium of 2.9, calcium of 8.6, CBC  showed WBC of 1.9, absolute neutrophil of 0.9,  We reviewed MRI of the brain with without contrast May 04, 2022 that was normal   PHYSICAL EXAM:   Vitals:   06/20/22 1453  BP: (!) 149/96  Pulse: 92  Weight: 177 lb (80.3 kg)  Height: 5\' 5"  (1.651 m)     Body mass index is 29.45 kg/m.  PHYSICAL EXAMNIATION:  Gen: NAD, conversant, well nourised, well groomed                     Cardiovascular: Regular rate rhythm, no peripheral edema, warm, nontender. Eyes: Conjunctivae clear without exudates or hemorrhage Neck: Supple, no carotid bruits. Pulmonary: Clear to auscultation bilaterally   NEUROLOGICAL EXAM:  MENTAL STATUS: Speech/cognition: Awake, alert, oriented to history taking and casual conversation    06/20/2022    2:45 PM  Montreal Cognitive Assessment   Visuospatial/ Executive (0/5) 5  Naming (0/3) 3  Attention: Read list of digits (0/2) 2  Attention: Read list of letters (0/1) 1  Attention: Serial 7 subtraction starting at 100 (0/3) 3  Language: Repeat phrase (0/2) 2  Language : Fluency (0/1) 1  Abstraction (0/2) 2  Delayed Recall (0/5) 5  Orientation (0/6) 6  Total 30    CRANIAL NERVES: CN II: Visual fields are full to confrontation. Pupils are round equal and briskly reactive to light. CN III, IV, VI: extraocular movement are normal. No ptosis. CN V: Facial sensation is intact to light touch CN VII: Face is symmetric with normal eye closure  CN VIII: Hearing is normal to causal conversation. CN IX, X: Phonation is normal. CN XI:  Head turning and shoulder shrug are intact  MOTOR: There is no pronator drift of out-stretched arms. Muscle bulk and tone are normal. Muscle strength is normal.  REFLEXES: Reflexes are 2+ and symmetric at the biceps, triceps, knees, and ankles. Plantar responses are flexor.  SENSORY: Intact to light touch, pinprick and vibratory sensation are intact in fingers and toes.  COORDINATION: There is no trunk or limb dysmetria  noted.  GAIT/STANCE: Posture is normal. Gait is steady with normal steps, base, arm swing, and turning. Heel and toe walking are normal. Tandem gait is normal.  Romberg is absent.  REVIEW OF SYSTEMS:  Full 14 system review of systems performed and notable only for as above All other review of systems were negative.   ALLERGIES: Allergies  Allergen Reactions   Ambien [Zolpidem] Other (See Comments)    Sleep walking, nightmares    HOME MEDICATIONS: Current Outpatient Medications  Medication Sig Dispense Refill   acetaminophen (TYLENOL) 500 MG tablet Take 1 tablet (500 mg total) by mouth every 6 (six) hours as needed. 100 tablet 0   atorvastatin (LIPITOR) 20 MG tablet Take 20 mg by mouth daily.     EPINEPHrine (EPIPEN 2-PAK) 0.3 mg/0.3 mL IJ SOAJ injection Inject 0.3 mg into the muscle as needed for anaphylaxis. 2 each 1   Potassium Chloride ER 20 MEQ TBCR Take 1 tablet by mouth daily.     benzonatate (TESSALON) 100 MG capsule Take 1 capsule (100 mg total) by mouth every 8 (eight) hours. (Patient not taking: Reported on 06/20/2022) 21 capsule 0   dicyclomine (BENTYL) 20 MG tablet Take 1 tablet (20 mg total) by mouth 2 (two) times daily. (Patient not taking: Reported on 06/20/2022) 20 tablet 0   levothyroxine (SYNTHROID) 50 MCG tablet Take 50 mcg by mouth daily. (Patient not taking: Reported on 06/20/2022)     ondansetron (ZOFRAN) 4 MG tablet Take 1 tablet (4 mg total) by mouth every 6 (six) hours. (Patient not taking: Reported on 06/20/2022) 12 tablet 0   pantoprazole (PROTONIX) 40 MG tablet Take by mouth. (Patient not taking: Reported on 06/20/2022)     triamterene-hydrochlorothiazide (DYAZIDE) 37.5-25 MG capsule Take 1 capsule by mouth daily. HCTZ (Patient not taking: Reported on 06/20/2022)     No current facility-administered medications for this visit.    PAST MEDICAL HISTORY: Past Medical History:  Diagnosis Date   Allergy    Anxiety    External hemorrhoid    GERD (gastroesophageal  reflux disease)    Headache(784.0)    Hypertension    Tenosynovitis of right wrist     PAST SURGICAL HISTORY: Past Surgical History:  Procedure Laterality Date   ABDOMINAL HYSTERECTOMY     partial   AUGMENTATION MAMMAPLASTY Bilateral    BREAST ENHANCEMENT SURGERY  2009   HEMORROIDECTOMY     PARTIAL HYSTERECTOMY  2010   VAGINAL DELIVERY  1996, 1998, 2002   WRIST ARTHROSCOPY Right 09/01/2016   Procedure: RIGHT WRIST ARTHROSCOPY WITH DEBRIDEMENT;  Surgeon: Betha Loa, MD;  Location: [facility];  Service: Orthopedics;  Laterality: Right;    FAMILY HISTORY: Family History  Problem Relation Age of Onset   Healthy Mother    Healthy Father    Eczema Daughter    Food Allergy Daughter    Food Allergy Daughter    Eczema Daughter    Eczema Daughter    Asthma Neg Hx    Allergic rhinitis Neg Hx    Urticaria Neg Hx    Angioedema Neg Hx  Immunodeficiency Neg Hx     SOCIAL HISTORY: Social History   Socioeconomic History   Marital status: Married    Spouse name: Not on file   Number of children: 3   Years of education: 12   Highest education level: Not on file  Occupational History   Occupation: Emergency planning/management officer  Tobacco Use   Smoking status: Some Days    Packs/day: .25    Types: Cigarettes   Smokeless tobacco: Never   Tobacco comments:    0-4 cigs a day. Smoking since 2008.   Vaping Use   Vaping Use: Never used  Substance and Sexual Activity   Alcohol use: Yes    Comment: 1-2 a day   Drug use: No   Sexual activity: Not on file  Other Topics Concern   Not on file  Social History Narrative   Lives with husband and two of her three daughters.   Right-handed.   Occasional caffeine use.   Social Determinants of Health   Financial Resource Strain: Not on file  Food Insecurity: Not on file  Transportation Needs: Not on file  Physical Activity: Not on file  Stress: Not on file  Social Connections: Not on file  Intimate Partner Violence: Not on file       Levert Feinstein, M.D. Ph.D.  [facility] [address], [address] [place], [place] [address] Ph: [phone] Fax: [phone]  CC:  Shon Hale, MD [address] [place],  [place] [address]  Shon Hale, MD

## 2022-06-20 NOTE — Telephone Encounter (Signed)
Call to patient, she reports large know under skin on left arm after having blood drawn this afternoon. She states it took a little bit to get the sample this visit.  She asks if she can stop by for [place] to look at.  Patient arrived to office a few minutes after 5pm and I and Psychologist, occupational, assessed site. Moderate size knot felt under skin at site but no bruising other than a pinpoint at injection site. Patient  denies pain, heat, or redness and none of the above present when assessing site. Patient is able to bend elbow. Advised that patient can take tylenol if she has taken before and can apply ice if needed. Advised to avoid NSAIDS.  Advised patient if she has pain, increased swelling, redness, heat/fever at site, or unable to move arm or if sites starts to bleed again to apply pressure and go to urgent care. Patient verbalized understanding and in agreement for me to follow up tomorrow. Update given to  Dr. Terrace Arabia and in agreement. Can offer visit tomorrow to patient is she would like Dr. Terrace Arabia to assess as well.

## 2022-06-20 NOTE — Telephone Encounter (Signed)
Pt is asking for a call to discuss Fifty cent piece size knot on arm from labs this afternoon, pt asking if this is normal and what she needs to do.

## 2022-06-21 ENCOUNTER — Telehealth: Payer: Self-pay

## 2022-06-21 ENCOUNTER — Telehealth: Payer: Self-pay | Admitting: Neurology

## 2022-06-21 ENCOUNTER — Ambulatory Visit: Payer: 59 | Admitting: Neurology

## 2022-06-21 DIAGNOSIS — R4189 Other symptoms and signs involving cognitive functions and awareness: Secondary | ICD-10-CM

## 2022-06-21 DIAGNOSIS — R4182 Altered mental status, unspecified: Secondary | ICD-10-CM | POA: Diagnosis not present

## 2022-06-21 DIAGNOSIS — G4730 Sleep apnea, unspecified: Secondary | ICD-10-CM

## 2022-06-21 DIAGNOSIS — E559 Vitamin D deficiency, unspecified: Secondary | ICD-10-CM

## 2022-06-21 LAB — COMPREHENSIVE METABOLIC PANEL
ALT: 31 IU/L (ref 0–32)
AST: 38 IU/L (ref 0–40)
Albumin/Globulin Ratio: 1.7 (ref 1.2–2.2)
Albumin: 4.8 g/dL (ref 3.9–4.9)
Alkaline Phosphatase: 125 IU/L — ABNORMAL HIGH (ref 44–121)
BUN/Creatinine Ratio: 15 (ref 9–23)
BUN: 10 mg/dL (ref 6–24)
Bilirubin Total: 0.4 mg/dL (ref 0.0–1.2)
CO2: 25 mmol/L (ref 20–29)
Calcium: 9.9 mg/dL (ref 8.7–10.2)
Chloride: 97 mmol/L (ref 96–106)
Creatinine, Ser: 0.66 mg/dL (ref 0.57–1.00)
Globulin, Total: 2.9 g/dL (ref 1.5–4.5)
Glucose: 91 mg/dL (ref 70–99)
Potassium: 4 mmol/L (ref 3.5–5.2)
Sodium: 139 mmol/L (ref 134–144)
Total Protein: 7.7 g/dL (ref 6.0–8.5)
eGFR: 109 mL/min/{1.73_m2} (ref >59)

## 2022-06-21 LAB — CBC WITH DIFFERENTIAL/PLATELET
Basophils Absolute: 0 10*3/uL (ref 0.0–0.2)
Basos: 0 %
EOS (ABSOLUTE): 0 10*3/uL (ref 0.0–0.4)
Eos: 1 %
Hematocrit: 41.8 % (ref 34.0–46.6)
Hemoglobin: 13.9 g/dL (ref 11.1–15.9)
Immature Grans (Abs): 0 10*3/uL (ref 0.0–0.1)
Immature Granulocytes: 0 %
Lymphocytes Absolute: 1.2 10*3/uL (ref 0.7–3.1)
Lymphs: 24 %
MCH: 29.3 pg (ref 26.6–33.0)
MCHC: 33.3 g/dL (ref 31.5–35.7)
MCV: 88 fL (ref 79–97)
Monocytes Absolute: 0.3 10*3/uL (ref 0.1–0.9)
Monocytes: 6 %
Neutrophils Absolute: 3.2 10*3/uL (ref 1.4–7.0)
Neutrophils: 69 %
Platelets: 322 10*3/uL (ref 150–450)
RBC: 4.75 x10E6/uL (ref 3.77–5.28)
RDW: 12.4 % (ref 11.7–15.4)
WBC: 4.7 10*3/uL (ref 3.4–10.8)

## 2022-06-21 LAB — SEDIMENTATION RATE: Sed Rate: 13 mm/hr (ref 0–32)

## 2022-06-21 LAB — THYROID PANEL WITH TSH
Free Thyroxine Index: 1.4 (ref 1.2–4.9)
T3 Uptake Ratio: 30 % (ref 24–39)
T4, Total: 4.7 ug/dL (ref 4.5–12.0)
TSH: 1.91 u[IU]/mL (ref 0.450–4.500)

## 2022-06-21 LAB — VITAMIN B12: Vitamin B-12: 361 pg/mL (ref 232–1245)

## 2022-06-21 LAB — HIV ANTIBODY (ROUTINE TESTING W REFLEX): HIV Screen 4th Generation wRfx: NONREACTIVE

## 2022-06-21 LAB — HGB A1C W/O EAG: Hgb A1c MFr Bld: 5.5 % (ref 4.8–5.6)

## 2022-06-21 LAB — C-REACTIVE PROTEIN: CRP: <1 mg/L (ref 0–10)

## 2022-06-21 LAB — RPR: RPR Ser Ql: NONREACTIVE

## 2022-06-21 LAB — ANA W/REFLEX IF POSITIVE: Anti Nuclear Antibody (ANA): NEGATIVE

## 2022-06-21 LAB — VITAMIN D 25 HYDROXY (VIT D DEFICIENCY, FRACTURES): Vit D, 25-Hydroxy: 9.7 ng/mL — ABNORMAL LOW (ref 30.0–100.0)

## 2022-06-21 MED ORDER — VITAMIN D (ERGOCALCIFEROL) 1.25 MG (50000 UNIT) PO CAPS: 50000.0000 [IU] | ORAL_CAPSULE | ORAL | 0 refills | Status: DC

## 2022-06-21 NOTE — Telephone Encounter (Signed)
Meds ordered this encounter  Medications   Vitamin D, Ergocalciferol, (DRISDOL) 1.25 MG (50000 UNIT) CAPS capsule    Sig: Take 1 capsule (50,000 Units total) by mouth every 7 (seven) days.    Dispense:  10 capsule    Refill:  0

## 2022-06-21 NOTE — Telephone Encounter (Signed)
Call to follow up on patients arm from blood draw yesterday. She states that it is fine and the know has gone down. She has an appointment for EEG today and I will remind Dr. Terrace Arabia if she wants to stop by sleep lab.Patient appreciative of call

## 2022-06-23 NOTE — Procedures (Signed)
   HISTORY: 47 year old female complains of cognitive impairment  TECHNIQUE:  This is a routine 16 channel EEG recording with one channel devoted to a limited EKG recording.  It was performed during wakefulness, drowsiness and asleep.  Hyperventilation and photic stimulation were performed as activating procedures.  There are minimum muscle and movement artifact noted.  Upon maximum arousal, posterior dominant waking rhythm consistent of rhythmic alpha range activity. Activities are symmetric over the bilateral posterior derivations and attenuated with eye opening.  Photic stimulation did not alter the tracing.  Hyperventilation produced mild/moderate buildup with higher amplitude and the slower activities noted.  During EEG recording, patient developed drowsiness and entered sleep, sleep EEG demonstrated architecture, there were frontal centrally dominant vertex waves and symmetric sleep spindles noted.  During EEG recording, there was no epileptiform discharge noted.  EKG demonstrate normal sinus rhythm.  CONCLUSION: This is a  normal awake  and asleep EEG.  There is no electrodiagnostic evidence of epileptiform discharge.  Levert Feinstein, M.D. Ph.D.  [facility] [address] [place], [place] [address] Phone: [phone] Fax:      [phone]

## 2022-06-24 ENCOUNTER — Encounter: Payer: Self-pay | Admitting: Neurology

## 2022-06-24 LAB — DRUG SCREEN 10 W/CONF, SERUM
Amphetamines, IA: NEGATIVE ng/mL
Barbiturates, IA: NEGATIVE ug/mL
Benzodiazepines, IA: NEGATIVE ng/mL
Cocaine & Metabolite, IA: NEGATIVE ng/mL
Methadone, IA: NEGATIVE ng/mL
Opiates, IA: NEGATIVE ng/mL
Oxycodones, IA: NEGATIVE ng/mL
Phencyclidine, IA: NEGATIVE ng/mL
Propoxyphene, IA: NEGATIVE ng/mL
THC(Marijuana) Metabolite, IA: NEGATIVE ng/mL

## 2022-06-27 NOTE — Telephone Encounter (Signed)
Vitamin D, Ergocalciferol, (DRISDOL) 1.25 MG (50000 UNIT) CAPS capsule 50,000 Units, Every 7 days 0 ordered       Summary: Take 1 capsule (50,000 Units total) by mouth every 7 (seven) days., Starting Tue 06/21/2022, Normal Dose, Route, Frequency: 50,000 Units, Oral, Every 7 daysStart: 05/07/2024Ord/Sold: 06/21/2022 (O)Ordered On: 05/07/2024Pharmacy: [facility] [id number] - [place], [place] - [address]     Please call patient, laboratory evaluation showed  1.  Significant vitamin D deficiency, I have called prescription vitamin D to her pharmacy, after she finished prescription vitamin D, should continue with over-the-counter D3 supplement, 1000 units 2 tablets daily  2.  Rest of the laboratory evaluation showed no significant abnormalities.

## 2022-06-29 ENCOUNTER — Encounter: Payer: Self-pay | Admitting: Neurology

## 2022-06-29 ENCOUNTER — Ambulatory Visit (INDEPENDENT_AMBULATORY_CARE_PROVIDER_SITE_OTHER): Payer: 59 | Admitting: Neurology

## 2022-06-29 VITALS — BP 160/96 | HR 95 | Ht 63.0 in | Wt 177.8 lb

## 2022-06-29 DIAGNOSIS — G4711 Idiopathic hypersomnia with long sleep time: Secondary | ICD-10-CM | POA: Diagnosis not present

## 2022-06-29 DIAGNOSIS — G4752 REM sleep behavior disorder: Secondary | ICD-10-CM

## 2022-06-29 DIAGNOSIS — G9331 Postviral fatigue syndrome: Secondary | ICD-10-CM | POA: Diagnosis not present

## 2022-06-29 DIAGNOSIS — G9332 Myalgic encephalomyelitis/chronic fatigue syndrome: Secondary | ICD-10-CM

## 2022-06-29 DIAGNOSIS — G4753 Recurrent isolated sleep paralysis: Secondary | ICD-10-CM | POA: Insufficient documentation

## 2022-06-29 DIAGNOSIS — R0683 Snoring: Secondary | ICD-10-CM

## 2022-06-29 DIAGNOSIS — F518 Other sleep disorders not due to a substance or known physiological condition: Secondary | ICD-10-CM | POA: Insufficient documentation

## 2022-06-29 NOTE — Patient Instructions (Signed)
Hypersomnia Hypersomnia is a condition in which a person feels very tired during the day even though the person gets plenty of sleep at night. A person with this condition may take naps during the day and may find it very difficult to wake up from sleep. Hypersomnia may affect a person's ability to think, concentrate, drive, or remember things. What are the causes? The cause of this condition may not be known. Possible causes include: Taking certain medicines. Using drugs or alcohol. Sleep disorders, such as narcolepsy and sleep apnea. Injury to the head, brain, or spinal cord. Tumors. Certain medical conditions. These include: Depression. Diabetes. Gastroesophageal reflux disease (GERD). An underactive thyroid gland (hypothyroidism). What are the signs or symptoms? The main symptoms of hypersomnia include: Feeling very tired throughout the day, regardless of how much sleep you got the night before. Having trouble waking up. Others may find it difficult to wake you up when you are sleeping. Sleeping for longer and longer periods at a time. Taking naps throughout the day. Other symptoms may include: Feeling restless, anxious, or annoyed. Lacking energy. Having trouble with: Remembering. Speaking. Thinking. Loss of appetite. Seeing, hearing, tasting, smelling, or feeling things that are not real (hallucinations). How is this diagnosed? This condition may be diagnosed based on: Your symptoms and medical history. Your sleeping habits. Your health care provider may ask you to write down your sleeping habits in a daily sleep log, along with any symptoms you have. A series of tests that are done while you sleep (sleep study or polysomnogram). A test that measures how quickly you can fall asleep during the day (daytime nap study or multiple sleep latency test). How is this treated? This condition may be treated by: Following a regular sleep routine. Making lifestyle changes, such as  changing your eating habits, getting regular exercise, and avoiding alcohol or caffeinated beverages. Taking medicines to make you more alert (stimulants) during the day. Treating any underlying medical causes of hypersomnia. Follow these instructions at home: Sleep habits Stick to a routine that includes going to bed and waking up at the same times every day and night. Practice a relaxing bedtime routine. This may include reading, meditation, deep breathing, or taking a warm bath before going to sleep. Exercise regularly as told by your health care provider. However, avoid exercising in the hours right before bedtime. Keep your sleep environment at a cooler temperature, darkened, and quiet. Sleep with pillows and a mattress that are comfortable and supportive. Schedule short 20-minute naps for when you feel sleepiest during the day. Talk with your employer or teachers about your hypersomnia. If possible, adjust your schedule so that: You have a regular daytime work schedule. You can take a scheduled nap during the day. You do not have to work or be active at night. Do not eat a heavy meal for a few hours before bedtime. Eat your meals at about the same times every day. Safety  Do not drive or use machinery if you are sleepy. Ask your health care provider if it is safe for you to drive. Wear a life jacket when swimming or spending time near water. General instructions  Take over-the-counter and prescription medicines only as told by your health care provider. This includes supplements. Avoid drinking alcohol or caffeinated beverages. Keep a sleep log that will help your health care provider manage your condition. This may include information about: What time you go to bed each night. How often you wake up at night. How many hours   you sleep at night. How often and for how long you nap during the day. Any observations from others, such as leg movements during sleep, sleep walking, or  snoring. Keep all follow-up visits. This is important. Contact a health care provider if: You have new symptoms. Your symptoms get worse. Get help right away if: You have thoughts about hurting yourself or someone else. Get help right away if you feel like you may hurt yourself or others, or have thoughts about taking your own life. Go to your nearest emergency room or: Call 911. Call the National Suicide Prevention Lifeline at 1-800-273-8255 or 988. This is open 24 hours a day. Text the Crisis Text Line at 741741. Summary Hypersomnia refers to a condition in which you feel very tired during the day even though you get plenty of sleep at night. A person with this condition may take naps during the day and may find it very difficult to wake up from sleep. Hypersomnia may affect a person's ability to think, concentrate, drive, or remember things. Treatment may include a regular sleep routine and making some lifestyle changes. This information is not intended to replace advice given to you by your health care provider. Make sure you discuss any questions you have with your health care provider. Document Revised: 01/11/2021 Document Reviewed: 01/11/2021 Elsevier Patient Education  2023 Elsevier Inc. Narcolepsy Narcolepsy is a disorder that causes people to fall asleep suddenly and without control (have sleep attacks) during the daytime. It is a lifelong disorder. Narcolepsy disrupts the sleep cycle at night, which then causes daytime sleepiness. What are the causes? The cause of narcolepsy is not fully understood, but it may be related to: Low levels of hypocretin, a chemical (neurotransmitter) in the brain that controls sleep and wake cycles. Hypocretin imbalance may be caused by: Abnormal genes that are passed from parent to child (inherited). An autoimmune disease in which the body's defense system (immune system) attacks the brain cells that make hypocretin. Infection, tumor, or injury in  the area of the brain that controls sleep. Exposure to poisons (toxins), such as heavy metals, pesticides, and secondhand smoke. What are the signs or symptoms? Symptoms of this condition include: Excessive daytime sleepiness. This is the most common symptom and is usually the first symptom you will notice. This may affect your performance at work or school. Sleep attacks. You may fall asleep in the middle of an activity, especially low-energy activities like reading or watching TV. Feeling like you cannot think clearly and having trouble focusing or remembering things. You may also feel depressed. Sudden muscle weakness (cataplexy). When this occurs, your speech may become slurred, or your knees may buckle. Cataplexy is usually triggered by surprise, anger, fear, or laughter. Losing the ability to speak or move (sleep paralysis). This may occur just as you start to fall asleep or wake up. You will be aware of the paralysis. It usually lasts for just a few seconds or minutes. Seeing, hearing, tasting, smelling, or feeling things that are not real (hallucinations). Hallucinations may occur with sleep paralysis. They can happen when you are falling asleep, waking up, or dozing. Trouble staying asleep at night (insomnia) and restless sleep. How is this diagnosed? This condition may be diagnosed based on: A physical exam to rule out any other problems that may be causing your symptoms. You may be asked to write down your sleeping patterns for several weeks in a sleep diary. This will help your health care provider make a diagnosis. Sleep   studies that measure how well your REM sleep is regulated. These tests also measure your heart rate, breathing, movement, and brain waves. These tests include: An overnight sleep study (polysomnogram). A daytime sleep study that is done while you take several naps during the day (multiple sleep latency test, MSLT). This test measures how quickly you fall asleep and how  quickly you enter REM sleep. Removal of spinal fluid to measure hypocretin levels. How is this treated? There is no cure for this condition, but treatment can help relieve symptoms. Treatment may include: Lifestyle and sleeping strategies to help you cope with the condition, such as: Exercising regularly. Maintaining a regular sleep schedule. Avoiding caffeine and large meals before bed. Medicines. These may include: Medicines that help keep you awake and alert (stimulants) to fight daytime sleepiness. Medicines that treat depression (antidepressants). These may be used to treat cataplexy. Sodium oxybate. This is a strong medicine to help you relax (sedative) that you may take at night. It can help control daytime sleepiness and cataplexy. Other treatments may include mental health counseling or joining a support group. Follow these instructions at home: Sleeping habits  Get about 8 hours of sleep every night. Go to sleep and get up at about the same time every day. Keep your bedroom dark, quiet, and comfortable. When you feel very tired, take short naps. Schedule naps so that you take them at about the same time every day. Before bedtime: Avoid bright lights and screens. Relax. Try activities like reading or taking a warm bath. Activity Get at least 20 minutes of exercise every day. This will help you sleep better at night and reduce daytime sleepiness. Avoid exercising within 3 hours of bedtime. Do not drive or use machinery if you are sleepy. If possible, take a nap before driving. Do not swim or go out on the water without a life jacket. Eating and drinking Do not drink alcohol or caffeinated beverages within 4-5 hours of bedtime. Do not eat a large meal before bedtime. Eat meals at about the same times every day. General instructions  Take over-the-counter and prescription medicines only as told by your health care provider. Keep a sleep diary as told by your health care  provider. Tell your employer or teachers that you have narcolepsy. You may be able to adjust your schedule to include time for naps. Do not use any products that contain nicotine or tobacco. These products include cigarettes, chewing tobacco, and vaping devices, such as e-cigarettes. If you need help quitting, ask your health care provider. Where to find more information National Institute of Neurological Disorders and Stroke: ninds.nih.gov Contact a health care provider if: Your symptoms are not getting better. You have fast or irregular heartbeats (palpitations). You are having a hard time determining what is real and what is not (psychosis). Get help right away if: You hurt yourself during a sleep attack or an attack of cataplexy. You have chest pain. You have trouble breathing. These symptoms may be an emergency. Get help right away. Call 911. Do not wait to see if the symptoms will go away. Do not drive yourself to the hospital. Summary Narcolepsy is a disorder that causes people to fall asleep suddenly and without control during the daytime (sleep attacks). Narcolepsy is a lifelong disorder with no cure. Treatment can help relieve symptoms. Go to sleep and get up at about the same time every day. Follow instructions about sleep and activities as told by your health care provider. Take over-the-counter and   prescription medicines only as told by your health care provider. This information is not intended to replace advice given to you by your health care provider. Make sure you discuss any questions you have with your health care provider. Document Revised: 06/04/2021 Document Reviewed: 06/04/2021 Elsevier Patient Education  2023 Elsevier Inc.  
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
## 2022-07-01 ENCOUNTER — Telehealth: Payer: Self-pay | Admitting: Neurology

## 2022-07-01 DIAGNOSIS — F518 Other sleep disorders not due to a substance or known physiological condition: Secondary | ICD-10-CM

## 2022-07-01 DIAGNOSIS — G4710 Hypersomnia, unspecified: Secondary | ICD-10-CM

## 2022-07-01 NOTE — Telephone Encounter (Signed)
NPSG- [employer] pending uploaded notes.

## 2022-07-07 ENCOUNTER — Ambulatory Visit: Payer: 59 | Admitting: Neurology

## 2022-07-12 NOTE — Telephone Encounter (Signed)
[employer] denied the NPSG  Can you put a HST order in?

## 2022-07-12 NOTE — Addendum Note (Signed)
Addended by: Judi Cong on: 07/12/2022 03:27 PM   Modules accepted: Orders

## 2022-07-13 NOTE — Telephone Encounter (Signed)
Noted, thank you. HST- [employer] no auth req.  

## 2022-08-02 ENCOUNTER — Ambulatory Visit: Payer: 59 | Admitting: Neurology

## 2022-08-02 DIAGNOSIS — G4710 Hypersomnia, unspecified: Secondary | ICD-10-CM

## 2022-08-02 DIAGNOSIS — F518 Other sleep disorders not due to a substance or known physiological condition: Secondary | ICD-10-CM

## 2022-08-09 ENCOUNTER — Encounter: Payer: Self-pay | Admitting: Neurology

## 2022-08-23 ENCOUNTER — Ambulatory Visit (INDEPENDENT_AMBULATORY_CARE_PROVIDER_SITE_OTHER): Payer: 59 | Admitting: Neurology

## 2022-08-23 DIAGNOSIS — G4710 Hypersomnia, unspecified: Secondary | ICD-10-CM | POA: Diagnosis not present

## 2022-08-23 DIAGNOSIS — F518 Other sleep disorders not due to a substance or known physiological condition: Secondary | ICD-10-CM

## 2022-08-24 NOTE — Progress Notes (Signed)
[facility]  Judith [name] 47 year old female 1975-08-16   HOME SLEEP TEST REPORT ( by Watch PAT)   STUDY DATE:  08-25-2022    ORDERING CLINICIAN: Melvyn Novas, MD  REFERRING CLINICIAN:  Dr Terrace Arabia   CLINICAL INFORMATION/HISTORY: Judith [name] is a 47 y.o. female   Cognitive impairment in young At risk for obstructive sleep apnea             MoCA examination 30/30             MRI of the brain was normal             Hypothyroidism but not compliant with her thyroid supplement             Laboratory evaluation in February showed multiple abnormalities, will repeat laboratory evaluations, to rule out metabolic disarrangement, thyroid malfunction, infectious, inflammatory process, her mood disorder likely play a role as well             EEG             Referral to sleep study     MEDICAL HISTORY:   Judith [name] is a 47 year old female, seen in request by her primary care physician Dr. Garth Bigness for evaluation of cognitive impairment, initial evaluation was on Jun 20, 2022   I reviewed and summarized the referring note. PMHX. 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   She had long history of excessive sleepiness, over the past weekend, she spent 80% of her time sleeping, has loud snoring, frequent awakening, sometimes wake herself catching her breath     Epworth sleepiness score: 15/24.  FSS  at  60/ 63 points.    BMI:  29.5 kg/m   Neck Circumference: 15 inches   FINDINGS:   Sleep Summary:   Total Recording Time (hours, min):     7 h 20 min   Total Sleep Time (hours, min):   5 h  42 min         Percent REM (%): 23.1/h                                       Respiratory Indices:   Calculated pAHI (per hour):  22.8/h                           REM pAHI:   45.7/h                                              NREM pAHI:  15.9/h  Supine AHI:  22.3/h Vs an no-supine AHi of  25/h                                                 Oxygen Saturation Statistics:   O2 Saturation Range (%):   between a NADIT at 71 % and max sat at 99%, mean 02 saturation was 91%                                    O2 Saturation (minutes) <89%:   12.8 minutes,  and duration of sleep hypoxia under 90% saturation was 42 minutes (!)         Pulse Rate Statistics:   Pulse Mean (bpm):   85 bpm              Pulse Range:  between 67 and 124 bpm               IMPRESSION:  This HST confirms the presence of moderate obstructive sleep apnea but with a strong REM sleep dependence.  A positional dependence on the other hand was not noted. REM sleep dependent apnea is mainly a hypopnea form, a sleep disordered breathing during REM sleep that leads to insufficient air exchange  while striated muscles are in a physiological state of  paralyzation.     RECOMMENDATION:This Apnea is not mild and because of the associated REM sleep dependency and hypoxia  a treatment with PAP ( positive airway pressure ) is recommended.   Auto titration PAP with a setting from 5-16 cm water, 2 cm EPR and heated humidification with a small facial imprint ( nasal pillow or nasal cradle ) is preferred.  RV after 30 and before 89th day of therapy.   INTERPRETING PHYSICIAN:   Melvyn Novas, MD

## 2022-08-27 ENCOUNTER — Other Ambulatory Visit: Payer: Self-pay | Admitting: Neurology

## 2022-09-02 ENCOUNTER — Other Ambulatory Visit: Payer: Self-pay | Admitting: Neurology

## 2022-09-02 DIAGNOSIS — G4710 Hypersomnia, unspecified: Secondary | ICD-10-CM | POA: Insufficient documentation

## 2022-09-02 NOTE — Procedures (Signed)
Judith [name] 47 year old female Apr 28, 1975   HOME SLEEP TEST REPORT ( by Watch PAT)   STUDY DATE:  08-25-2022    ORDERING CLINICIAN: Melvyn Novas, MD  REFERRING CLINICIAN:  Dr Terrace Arabia   CLINICAL INFORMATION/HISTORY: Judith [name] is a 47 y.o. female   Cognitive impairment in young At risk for obstructive sleep apnea             MoCA examination 30/30             MRI of the brain was normal             Hypothyroidism but not compliant with her thyroid supplement             Laboratory evaluation in February showed multiple abnormalities, will repeat laboratory evaluations, to rule out metabolic disarrangement, thyroid malfunction, infectious, inflammatory process, her mood disorder likely play a role as well             EEG             Referral to sleep study     MEDICAL HISTORY:   Judith [name] is a 47 year old female, seen in request by her primary care physician Dr. Garth Bigness for evaluation of cognitive impairment, initial evaluation was on Jun 20, 2022   I reviewed and summarized the referring note. PMHX. 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   She had long history of excessive sleepiness, over the past weekend, she spent 80% of her time sleeping, has loud snoring, frequent awakening, sometimes wake herself catching her breath     Epworth sleepiness score: 15/24.  FSS at  60/ 63 points.    BMI:  29.5  kg/m   Neck Circumference: 15 inches   FINDINGS:   Sleep Summary:   Total Recording Time (hours, min):     7 h 20 min   Total Sleep Time (hours, min):   5 h  42 min         Percent REM (%): 23.1/h                                       Respiratory Indices:   Calculated pAHI (per hour):  22.8/h                           REM pAHI:   45.7/h                                              NREM pAHI:  15.9/h                            Supine AHI:  22.3/h Vs an no-supine AHi of  25/h                                                 Oxygen Saturation Statistics:   O2 Saturation Range (%):   between a NADIT at 71 % and max sat at 99%, mean 02 saturation was 91%                                    O2 Saturation (minutes) <89%:   12.8 minutes,  and duration of sleep hypoxia under 90% saturation was 42 minutes (!)         Pulse Rate Statistics:   Pulse Mean (bpm):   85 bpm              Pulse Range:  between 67 and 124 bpm               IMPRESSION:  This HST confirms the presence of moderate obstructive sleep apnea but with a strong REM sleep dependence.  A positional dependence on the other hand was not noted. REM sleep dependent apnea is mainly a hypopnea form, a sleep disordered breathing during REM sleep that leads to insufficient air exchange  while striated muscles are in a physiological state of  paralyzation.     RECOMMENDATION:This Apnea is not mild and because of the associated REM sleep dependency and hypoxia  a treatment with PAP ( positive airway pressure ) is recommended.   Auto titration PAP with a setting from 5-16 cm water, 2 cm EPR and heated humidification with a small facial imprint ( nasal pillow or nasal cradle ) is preferred.  RV after 30 and before 89th day of therapy.   INTERPRETING PHYSICIAN:   Melvyn Novas, MD

## 2022-09-06 NOTE — Telephone Encounter (Signed)
Called patient to discuss sleep study results. No answer at this time. LVM for the patient to call back.  Will send a mychart message as well. 

## 2022-09-06 NOTE — Telephone Encounter (Signed)
-----   Message from [place] sent at 09/02/2022  4:36 PM EDT -----  This HST confirms the presence of moderate obstructive sleep apnea but with a strong REM sleep dependence.  A positional dependence on the other hand was not noted. REM sleep dependent apnea is mainly a hypopnea form, a sleep disordered breathing during REM sleep that leads to insufficient air exchange  while striated muscles are in a physiological state of  paralyzation.      RECOMMENDATION:This Apnea is not mild and because of the associated REM sleep dependency and hypoxia  at a NADIR of  71% sat. , a treatment with PAP ( positive airway pressure ) is recommended.    Auto titration PAP with a setting from 5-16 cm water, 2 cm EPR and heated humidification with a small facial imprint ( nasal pillow or nasal cradle ) is preferred.  RV after 30 and before 89th day of therapy.

## 2022-09-07 ENCOUNTER — Telehealth: Payer: Self-pay | Admitting: Neurology

## 2022-09-07 NOTE — Telephone Encounter (Signed)
Called patient to discuss sleep study results. No answer at this time. LVM for the patient to call back.   Will send a [facility] message

## 2022-09-07 NOTE — Telephone Encounter (Signed)
-----   Message from [place] sent at 09/02/2022  4:36 PM EDT -----  This HST confirms the presence of moderate obstructive sleep apnea but with a strong REM sleep dependence.  A positional dependence on the other hand was not noted. REM sleep dependent apnea is mainly a hypopnea form, a sleep disordered breathing during REM sleep that leads to insufficient air exchange  while striated muscles are in a physiological state of  paralyzation.      RECOMMENDATION:This Apnea is not mild and because of the associated REM sleep dependency and hypoxia  at a NADIR of  71% sat. , a treatment with PAP ( positive airway pressure ) is recommended.    Auto titration PAP with a setting from 5-16 cm water, 2 cm EPR and heated humidification with a small facial imprint ( nasal pillow or nasal cradle ) is preferred.  RV after 30 and before 89th day of therapy.

## 2022-09-22 ENCOUNTER — Telehealth: Payer: Self-pay | Admitting: Neurology

## 2022-09-22 NOTE — Telephone Encounter (Signed)
Pt was scheduled for her initial CPAP on (12/06/22) Pt was informed to bring machine and power cord to the appointment. DME in pt's SnapShot and between dates are:10/23/22-12/22/22

## 2022-11-07 ENCOUNTER — Other Ambulatory Visit: Payer: Self-pay | Admitting: Neurology

## 2022-12-06 ENCOUNTER — Ambulatory Visit: Payer: 59 | Admitting: Neurology

## 2022-12-06 ENCOUNTER — Encounter: Payer: Self-pay | Admitting: Neurology

## 2022-12-06 VITALS — BP 163/106 | HR 82 | Ht 63.0 in | Wt 174.0 lb

## 2022-12-06 DIAGNOSIS — G4711 Idiopathic hypersomnia with long sleep time: Secondary | ICD-10-CM

## 2022-12-06 DIAGNOSIS — F518 Other sleep disorders not due to a substance or known physiological condition: Secondary | ICD-10-CM

## 2022-12-06 DIAGNOSIS — G4733 Obstructive sleep apnea (adult) (pediatric): Secondary | ICD-10-CM

## 2022-12-06 DIAGNOSIS — R0683 Snoring: Secondary | ICD-10-CM

## 2022-12-06 NOTE — Progress Notes (Signed)
Provider:  Melvyn Novas, MD  Primary Care Physician:  Shon Hale, MD [address] [place] [address]     Referring Provider: Dr Terrace Arabia, [facility]        Chief Complaint according to patient   Patient presents with:     New Patient (Initial Visit)           HISTORY OF PRESENT ILLNESS:  Judith [name] is a 47 y.o. female patient who is here for revisit 12/06/2022 for  CPAP follow up- .  Chief concern according to patient : The patient underwent a home sleep test by [facility] on 08-25-2022.  She was referred for sleep study by Dr. Maple Hudson.  Her Epworth sleepiness scale was endorsed at 15 out of 24 points and her fatigue severity scale at 60 out of 63 points at the time BMI was 29.5 kg gram per meter square and her neck circumference was 15 inches.  Her AHI was 22.8/h so this is a moderate degree of sleep apnea which was strongly accentuated or dependent and REM sleep the REM AHI was 45.7/h the non-REM AHI was about 16/h in supine sleep or nonsupine sleep the AHI is did not vary very much.  She had a nadir of oxygen at 71% and was 12.8 minutes and hypoxia total.  So I recommended auto titration CPAP with a setting from 5 through 16 cm water was 2 cm EPR and heated humidification she had asked for a mask with a small facial injury and and nasal pillow was the preferred option.  She was against my orders not given a nasal pillow or nasal cradle but a full facemask.  Her DME is [employer] by the way.  Her compliance is 68% for the last 90 days actually compliance was highest through the months of August and September and has dropped off during October.  The average daily use is 4 hours 9 minutes.  The compliance for 24 hours is only 28%.  So I need her to work on 30 more minutes each day of using CPAP.  She has an AutoSet at manufacture settings between 4 and 20 cm water was 2 cm EPR and her residual apnea index is a dream.  She only has 0.2 apneas and hypopneas per  hour left from over 20 at baseline.  No central apneas are emerging very little air leak in spite of a fullface mask.    Her 95th percentile pressure is 8.5 cm water.  So what I like today is to reduce the maximum pressure from 20 to 14 cm water she does not need the whole span .   She will be using the humidifier as she needs it.      Judith [name] is a 47 y.o. female patient who is seen upon referral on 06/29/2022 from Dr Terrace Arabia  for a sleep study and evaluation. .  Chief concern according to patient :  She snores and quits breathing at night.  She reports violent dreams at night. 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 She reports ongoing Hypervigilance, affecting daily life and her her sleep. Irregular work hours and hypersomnolence. .     I have the pleasure of seeing Judith [name] 06/29/22 a right-handed AA female with a possible sleep disorder. She snores and quits breathing at night. Her work up with Dr Terrace Arabia was for non-formed visual hallucinations,  more  from the corner of the eye.        (She has been seen by Dr Terrace Arabia since 2016 for other problems.)  Dr Terrace Arabia :  See below : Judith [name] is a 47 year old female, seen in request by her primary care physician Dr. Garth Bigness for evaluation of cognitive impairment, initial evaluation was on Jun 20, 2022   I reviewed and summarized the referring note. PMHX. HTN Hypothyroidism GERD HLD Anxiety   Since 2023, she noticed intermittent cognitive malfunction, tends to repeat questions, has to pay close attention to be able to follow the conversation of her coworker, she also described visual hallucinations, while watching TV with her husband, she saw gray object running across the floor, which was not witnessed by her husband, also gave examples of see something coming from her side, she has to dodge it. 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  Hypersomnia with prolonged sleep time, the patient also reports always a latent sense of fatigue a chronic sense of fatigue.  If she does not have to go to work she can sleep on and off through the day.  No matter how many hours she sleeps, she does not feel refreshed.   2) the patient reports having vivid and often very scary dreams that affect her a little less than they used to in the past but the dreams are very realistic and feels visceral.  These are violent dreams.  She even reports that if her dream includes her getting hurt she feels the pain when she wakes up.    3) she has been known to snore and her husband seems to have witnessed some apneic events at night.  We will also discuss the screening for obstructive sleep apnea but I am mainly concerned about parasomnia or narcolepsy.  The patient is a shift worker with irregular hours.  She has been certainly exposed in her job to very stressful and life-threatening situations.  There is a possibility that PTSD can play a role here.  I would like for her to have an attended sleep study with a parasomnia montage.   4) To reduce snoring and apnea, I will ask he to avoid supine sleep, and reduce alcohol intake.    5) I suggest to address PTSD/ hypervigilance with a police psychologist.   Review of Systems: Out of a complete 14 system review, the patient complains of only the following symptoms, and all other reviewed systems are negative.:  Fatigue, sleepiness , snoring, fragmented sleep, Insomnia, PTSD, Cervicalgia, back pain.    How likely are you to doze in the following situations: 0 = not likely, 1 = slight chance, 2 = moderate chance, 3 = high chance   Sitting and Reading? Watching Television? Sitting inactive in a public place (theater or meeting)? As a passenger in a car for an  hour without a break? Lying down in the afternoon when circumstances permit? Sitting and talking to someone? Sitting quietly after lunch without alcohol? In a car, while stopped for a few minutes in traffic?   Total = 7/ 24 points   FSS endorsed at 51/ 63 points.  This is not narcolepsy   Social History   Socioeconomic History   Marital status: Married    Spouse name:  Not on file   Number of children: 3   Years of education: 12   Highest education level: Not on file  Occupational History   Occupation: Emergency planning/management officer  Tobacco Use   Smoking status: Some Days    Current packs/day: 0.25    Types: Cigarettes   Smokeless tobacco: Never   Tobacco comments:    0-4 cigs a day. Smoking since 2008.   Vaping Use   Vaping status: Never Used  Substance and Sexual Activity   Alcohol use: Yes    Comment: 1-2 a day   Drug use: No   Sexual activity: Not on file  Other Topics Concern   Not on file  Social History Narrative   Lives with husband and two of her three daughters.   Right-handed.   Occasional caffeine use.   Social Determinants of Health   Financial Resource Strain: Not on file  Food Insecurity: Not on file  Transportation Needs: Not on file  Physical Activity: Not on file  Stress: Not on file  Social Connections: Unknown (06/18/2021)   [facility]   Social Network    Social Network: Not on file    Family History  Problem Relation Age of Onset   Healthy Mother    Healthy Father    Eczema Daughter    Food Allergy Daughter    Food Allergy Daughter    Eczema Daughter    Eczema Daughter    Asthma Neg Hx    Allergic rhinitis Neg Hx    Urticaria Neg Hx    Angioedema Neg Hx    Immunodeficiency Neg Hx     Past Medical History:  Diagnosis Date   Allergy    Anxiety    External hemorrhoid    GERD (gastroesophageal reflux disease)    Headache(784.0)    Hypertension    Tenosynovitis of right wrist     Past Surgical History:   Procedure Laterality Date   ABDOMINAL HYSTERECTOMY     partial   AUGMENTATION MAMMAPLASTY Bilateral    BREAST ENHANCEMENT SURGERY  2009   HEMORROIDECTOMY     PARTIAL HYSTERECTOMY  2010   VAGINAL DELIVERY  1996, 1998, 2002   WRIST ARTHROSCOPY Right 09/01/2016   Procedure: RIGHT WRIST ARTHROSCOPY WITH DEBRIDEMENT;  Surgeon: Betha Loa, MD;  Location: [facility];  Service: Orthopedics;  Laterality: Right;     Current Outpatient Medications on File Prior to Visit  Medication Sig Dispense Refill   acetaminophen (TYLENOL) 500 MG tablet Take 1 tablet (500 mg total) by mouth every 6 (six) hours as needed. 100 tablet 0   atorvastatin (LIPITOR) 20 MG tablet Take 20 mg by mouth daily.     dicyclomine (BENTYL) 20 MG tablet Take 1 tablet (20 mg total) by mouth 2 (two) times daily. 20 tablet 0   levothyroxine (SYNTHROID) 50 MCG tablet Take 50 mcg by mouth daily.     ondansetron (ZOFRAN) 4 MG tablet Take 1 tablet (4 mg total) by mouth every 6 (six) hours. 12 tablet 0   Potassium Chloride ER 20 MEQ TBCR Take 1 tablet by mouth daily.     triamterene-hydrochlorothiazide (DYAZIDE) 37.5-25 MG capsule Take 1 capsule by mouth daily. HCTZ     Vitamin D, Ergocalciferol, (DRISDOL) 1.25 MG (50000 UNIT) CAPS capsule TAKE 1 CAPSULE BY MOUTH ONCE WEEKLY 10 capsule 0   EPINEPHrine (EPIPEN 2-PAK) 0.3 mg/0.3 mL IJ SOAJ injection Inject 0.3 mg into the muscle as needed for anaphylaxis. (Patient  not taking: Reported on 12/06/2022) 2 each 1   No current facility-administered medications on file prior to visit.    Allergies  Allergen Reactions   Ambien [Zolpidem] Other (See Comments)    Sleep walking, nightmares   Prednisone Swelling     DIAGNOSTIC DATA (LABS, IMAGING, TESTING) - I reviewed patient records, labs, notes, testing and imaging myself where available.  Lab Results  Component Value Date   WBC 4.7 06/20/2022   HGB 13.9 06/20/2022   HCT 41.8 06/20/2022   MCV 88 06/20/2022   PLT 322  06/20/2022      Component Value Date/Time   NA 139 06/20/2022 1534   K 4.0 06/20/2022 1534   CL 97 06/20/2022 1534   CO2 25 06/20/2022 1534   GLUCOSE 91 06/20/2022 1534   GLUCOSE 119 (H) 03/29/2022 1045   BUN 10 06/20/2022 1534   CREATININE 0.66 06/20/2022 1534   CALCIUM 9.9 06/20/2022 1534   PROT 7.7 06/20/2022 1534   ALBUMIN 4.8 06/20/2022 1534   AST 38 06/20/2022 1534   ALT 31 06/20/2022 1534   ALKPHOS 125 (H) 06/20/2022 1534   BILITOT 0.4 06/20/2022 1534   GFRNONAA >60 03/29/2022 1045   GFRAA >60 11/24/2015 1610   No results found for: "CHOL", "HDL", "LDLCALC", "LDLDIRECT", "TRIG", "CHOLHDL" Lab Results  Component Value Date   HGBA1C 5.5 06/20/2022   Lab Results  Component Value Date   VITAMINB12 361 06/20/2022   Lab Results  Component Value Date   TSH 1.910 06/20/2022    PHYSICAL EXAM:  Today's Vitals   12/06/22 1438  BP: (!) 163/106  Pulse: 82  Weight: 174 lb (78.9 kg)  Height: 5\' 3"  (1.6 m)   Body mass index is 30.82 kg/m.   Wt Readings from Last 3 Encounters:  12/06/22 174 lb (78.9 kg)  06/29/22 177 lb 12.8 oz (80.6 kg)  06/20/22 177 lb (80.3 kg)     Ht Readings from Last 3 Encounters:  12/06/22 5\' 3"  (1.6 m)  06/29/22 5\' 3"  (1.6 m)  06/20/22 5\' 5"  (1.651 m)      General: The patient is awake, alert and appears not in acute distress. The patient is well groomed. Head:The patient is awake, alert and appears not in acute distress. The patient is well groomed. Head: Normocephalic, atraumatic. Neck is supple. Mallampati 2, ; lateral crowding. ,  neck circumference:15 inches . Nasal airflow patent.  Retrognathia is not seen.  Dental status: biological  Cardiovascular:  Regular rate and cardiac rhythm by pulse,  without distended neck veins. Respiratory: Lungs are clear to auscultation.  Skin:  Without evidence of ankle edema, or rash. Trunk: The patient's posture is erect.   NEUROLOGIC EXAM: The patient is awake and alert, oriented to place  and time.   Memory subjective described as impaired, missing parts of conversations.   Attention span & concentration ability appears normal.  Speech is fluent,  without  dysarthria, dysphonia or aphasia.  Mood and affect are anxious.    Cranial nerves: no loss of smell or taste reported  Pupils are equal and briskly reactive to light. Funduscopic exam .  Extraocular movements in vertical and horizontal planes were intact and without nystagmus. No Diplopia. Visual fields by finger perimetry are intact. Hearing was intact to soft voice and finger rubbing.    Facial sensation intact to fine touch.  Facial motor strength is symmetric and tongue and uvula move midline.  Neck ROM : rotation, tilt and flexion extension were normal for age  and shoulder shrug was symmetrical.    Motor exam:  Symmetric bulk, tone and ROM.   Normal tone without cog wheeling, symmetric grip strength .    ASSESSMENT AND PLAN 47 y.o. year old female  here with:    1) OSA on CPAP, but with variable compliance.   2) PTSD, but has not seen psychologist or counselor.   3)   No orders of the defined types were placed in this encounter.    Sleep follow up through our NP within 12 months.   I would like to thank Dr Terrace Arabia for allowing me to meet with and to take care of this pleasant patient.   CC: I will share my notes with Dr Terrace Arabia .  After spending a total time of  30  minutes face to face and additional time for physical and neurologic examination, review of laboratory studies, encouragement to increase compliance.  personal review of reports and results of other testing and review of referral information / records as far as provided in visit,   Electronically signed by: Melvyn Novas, MD 12/06/2022 3:03 PM  [facility] Board certified by The [employer] of Sleep Medicine and Diplomate of the [employer] of Sleep Medicine. Board certified In Neurology through  the ABPN, Fellow of the [employer] of Neu[employer].

## 2023-05-09 ENCOUNTER — Emergency Department (HOSPITAL_BASED_OUTPATIENT_CLINIC_OR_DEPARTMENT_OTHER)

## 2023-05-09 ENCOUNTER — Emergency Department (HOSPITAL_BASED_OUTPATIENT_CLINIC_OR_DEPARTMENT_OTHER)
Admission: EM | Admit: 2023-05-09 | Discharge: 2023-05-09 | Disposition: A | Discharge status: Home / Self Care | Attending: Emergency Medicine | Admitting: Emergency Medicine

## 2023-05-09 ENCOUNTER — Other Ambulatory Visit: Payer: Self-pay

## 2023-05-09 ENCOUNTER — Encounter (HOSPITAL_BASED_OUTPATIENT_CLINIC_OR_DEPARTMENT_OTHER): Payer: Self-pay

## 2023-05-09 DIAGNOSIS — Z79899 Other long term (current) drug therapy: Secondary | ICD-10-CM | POA: Insufficient documentation

## 2023-05-09 DIAGNOSIS — I10 Essential (primary) hypertension: Secondary | ICD-10-CM | POA: Diagnosis not present

## 2023-05-09 DIAGNOSIS — R109 Unspecified abdominal pain: Secondary | ICD-10-CM | POA: Diagnosis present

## 2023-05-09 DIAGNOSIS — E876 Hypokalemia: Secondary | ICD-10-CM | POA: Diagnosis not present

## 2023-05-09 DIAGNOSIS — R252 Cramp and spasm: Secondary | ICD-10-CM | POA: Insufficient documentation

## 2023-05-09 DIAGNOSIS — R9431 Abnormal electrocardiogram [ECG] [EKG]: Secondary | ICD-10-CM | POA: Insufficient documentation

## 2023-05-09 LAB — CBC WITH DIFFERENTIAL/PLATELET
Abs Immature Granulocytes: 0.02 10*3/uL (ref 0.00–0.07)
Basophils Absolute: 0 10*3/uL (ref 0.0–0.1)
Basophils Relative: 0 %
Eosinophils Absolute: 0.1 10*3/uL (ref 0.0–0.5)
Eosinophils Relative: 1 %
HCT: 34.7 % — ABNORMAL LOW (ref 36.0–46.0)
Hemoglobin: 12.3 g/dL (ref 12.0–15.0)
Immature Granulocytes: 0 %
Lymphocytes Relative: 13 %
Lymphs Abs: 1 10*3/uL (ref 0.7–4.0)
MCH: 31.3 pg (ref 26.0–34.0)
MCHC: 35.4 g/dL (ref 30.0–36.0)
MCV: 88.3 fL (ref 80.0–100.0)
Monocytes Absolute: 0.3 10*3/uL (ref 0.1–1.0)
Monocytes Relative: 5 %
Neutro Abs: 6 10*3/uL (ref 1.7–7.7)
Neutrophils Relative %: 81 %
Platelets: 245 10*3/uL (ref 150–400)
RBC: 3.93 MIL/uL (ref 3.87–5.11)
RDW: 13.3 % (ref 11.5–15.5)
WBC: 7.5 10*3/uL (ref 4.0–10.5)
nRBC: 0 % (ref 0.0–0.2)

## 2023-05-09 LAB — COMPREHENSIVE METABOLIC PANEL
ALT: 17 U/L (ref 0–44)
AST: 25 U/L (ref 15–41)
Albumin: 4 g/dL (ref 3.5–5.0)
Alkaline Phosphatase: 64 U/L (ref 38–126)
Anion gap: 10 (ref 5–15)
BUN: 14 mg/dL (ref 6–20)
CO2: 24 mmol/L (ref 22–32)
Calcium: 9.4 mg/dL (ref 8.9–10.3)
Chloride: 103 mmol/L (ref 98–111)
Creatinine, Ser: 0.61 mg/dL (ref 0.44–1.00)
GFR, Estimated: >60 mL/min (ref >60)
Glucose, Bld: 122 mg/dL — ABNORMAL HIGH (ref 70–99)
Potassium: 3.4 mmol/L — ABNORMAL LOW (ref 3.5–5.1)
Sodium: 137 mmol/L (ref 135–145)
Total Bilirubin: 0.8 mg/dL (ref 0.0–1.2)
Total Protein: 7.4 g/dL (ref 6.5–8.1)

## 2023-05-09 LAB — URINALYSIS, ROUTINE W REFLEX MICROSCOPIC
Bilirubin Urine: NEGATIVE
Glucose, UA: NEGATIVE mg/dL
Hgb urine dipstick: NEGATIVE
Ketones, ur: NEGATIVE mg/dL
Leukocytes,Ua: NEGATIVE
Nitrite: NEGATIVE
Protein, ur: NEGATIVE mg/dL
Specific Gravity, Urine: 1.025 (ref 1.005–1.030)
pH: 5.5 (ref 5.0–8.0)

## 2023-05-09 LAB — LIPASE, BLOOD: Lipase: 27 U/L (ref 11–51)

## 2023-05-09 LAB — TSH: TSH: 2.448 u[IU]/mL (ref 0.350–4.500)

## 2023-05-09 LAB — RESP PANEL BY RT-PCR (RSV, FLU A&B, COVID)  RVPGX2
Influenza A by PCR: NEGATIVE
Influenza B by PCR: NEGATIVE
Resp Syncytial Virus by PCR: NEGATIVE
SARS Coronavirus 2 by RT PCR: NEGATIVE

## 2023-05-09 LAB — PREGNANCY, URINE: Preg Test, Ur: NEGATIVE

## 2023-05-09 LAB — T4, FREE: Free T4: 0.77 ng/dL (ref 0.61–1.12)

## 2023-05-09 LAB — MAGNESIUM: Magnesium: 1.6 mg/dL — ABNORMAL LOW (ref 1.7–2.4)

## 2023-05-09 MED ORDER — ALUM & MAG HYDROXIDE-SIMETH 200-200-20 MG/5ML PO SUSP
30.0000 mL | Freq: Once | ORAL | Status: AC
  Administered 2023-05-09: 30 mL via ORAL
  Filled 2023-05-09: qty 30

## 2023-05-09 MED ORDER — MAGNESIUM SULFATE 2 GM/50ML IV SOLN
2.0000 g | Freq: Once | INTRAVENOUS | Status: AC
  Administered 2023-05-09: 2 g via INTRAVENOUS
  Filled 2023-05-09: qty 50

## 2023-05-09 MED ORDER — FAMOTIDINE IN NACL 20-0.9 MG/50ML-% IV SOLN
20.0000 mg | Freq: Once | INTRAVENOUS | Status: AC
  Administered 2023-05-09: 20 mg via INTRAVENOUS
  Filled 2023-05-09: qty 50

## 2023-05-09 MED ORDER — POTASSIUM CHLORIDE CRYS ER 20 MEQ PO TBCR
20.0000 meq | EXTENDED_RELEASE_TABLET | Freq: Once | ORAL | Status: AC
  Administered 2023-05-09: 20 meq via ORAL
  Filled 2023-05-09: qty 1

## 2023-05-09 MED ORDER — SUCRALFATE 1 G PO TABS: 1.0000 g | ORAL_TABLET | Freq: Three times a day (TID) | ORAL | 0 refills | Status: AC

## 2023-05-09 MED ORDER — DICYCLOMINE HCL 10 MG PO CAPS
10.0000 mg | ORAL_CAPSULE | Freq: Once | ORAL | Status: AC
  Administered 2023-05-09: 10 mg via ORAL
  Filled 2023-05-09: qty 1

## 2023-05-09 MED ORDER — LIDOCAINE VISCOUS HCL 2 % MT SOLN
15.0000 mL | Freq: Once | OROMUCOSAL | Status: AC
  Administered 2023-05-09: 15 mL via ORAL
  Filled 2023-05-09: qty 15

## 2023-05-09 MED ORDER — FENTANYL CITRATE PF 50 MCG/ML IJ SOSY
50.0000 ug | PREFILLED_SYRINGE | INTRAMUSCULAR | Status: AC | PRN
  Administered 2023-05-09: 50 ug via INTRAVENOUS
  Filled 2023-05-09: qty 1

## 2023-05-09 MED ORDER — IOHEXOL 300 MG/ML  SOLN
100.0000 mL | Freq: Once | INTRAMUSCULAR | Status: AC | PRN
  Administered 2023-05-09: 100 mL via INTRAVENOUS

## 2023-05-09 MED ORDER — PANTOPRAZOLE SODIUM 40 MG PO TBEC: 40.0000 mg | DELAYED_RELEASE_TABLET | Freq: Every day | ORAL | 0 refills | Status: AC

## 2023-05-09 MED ORDER — FAMOTIDINE 20 MG PO TABS: 20.0000 mg | ORAL_TABLET | Freq: Every day | ORAL | 0 refills | Status: AC

## 2023-05-09 MED ORDER — SUCRALFATE 1 G PO TABS
1.0000 g | ORAL_TABLET | Freq: Once | ORAL | Status: AC
  Administered 2023-05-09: 1 g via ORAL
  Filled 2023-05-09: qty 1

## 2023-05-09 MED ORDER — DICYCLOMINE HCL 20 MG PO TABS: 20.0000 mg | ORAL_TABLET | Freq: Two times a day (BID) | ORAL | 0 refills | Status: AC

## 2023-05-09 NOTE — ED Provider Notes (Signed)
 [place] [facility] AT [facility] Provider Note   CSN: [id number] Arrival date & time: 05/09/23  3244     History  Chief Complaint  Patient presents with   Abdominal Pain    Judith [name] is a 48 y.o. female.   Abdominal Pain    48 year old female with medical history significant for anxiety, hypertension, GERD, ileus on CT who presents to the emergency department with a chief complaint of abdominal pain.  The patient's last bowel movement was yesterday afternoon.  She denies any nausea, vomiting or diarrhea.  She has had intermittent upper abdominal cramping and discomfort since last night.  She last had a meal last night.  She denies any nausea or vomiting.  Her pain is currently a 0 but he is coming in waves and spasms and is severe when they occur.  She states that she is not currently passing gas and has had decreased oral intake today.  Home Medications Prior to Admission medications   Medication Sig Start Date End Date Taking? Authorizing Provider  famotidine (PEPCID) 20 MG tablet Take 1 tablet (20 mg total) by mouth daily. 05/09/23 06/08/23 Yes Ernie Avena, MD  pantoprazole (PROTONIX) 40 MG tablet Take 1 tablet (40 mg total) by mouth daily. 05/09/23 06/08/23 Yes Ernie Avena, MD  sucralfate (CARAFATE) 1 g tablet Take 1 tablet (1 g total) by mouth 4 (four) times daily -  with meals and at bedtime for 14 days. 05/09/23 05/23/23 Yes Ernie Avena, MD  acetaminophen (TYLENOL) 500 MG tablet Take 1 tablet (500 mg total) by mouth every 6 (six) hours as needed. 03/29/22   Redwine, Madison A, PA-C  atorvastatin (LIPITOR) 20 MG tablet Take 20 mg by mouth daily. 08/04/20   [provider]  dicyclomine (BENTYL) 20 MG tablet Take 1 tablet (20 mg total) by mouth 2 (two) times daily. 05/09/23   Ernie Avena, MD  EPINEPHrine (EPIPEN 2-PAK) 0.3 mg/0.3 mL IJ SOAJ injection Inject 0.3 mg into the muscle as needed for anaphylaxis. Patient not taking: Reported  on 12/06/2022 09/14/20   Marcelyn Bruins, MD  levothyroxine (SYNTHROID) 50 MCG tablet Take 50 mcg by mouth daily. 08/06/20   [provider]  ondansetron (ZOFRAN) 4 MG tablet Take 1 tablet (4 mg total) by mouth every 6 (six) hours. 03/29/22   Redwine, Madison A, PA-C  Potassium Chloride ER 20 MEQ TBCR Take 1 tablet by mouth daily. 09/03/20   [provider]  triamterene-hydrochlorothiazide (DYAZIDE) 37.5-25 MG capsule Take 1 capsule by mouth daily. HCTZ    [provider]  Vitamin D, Ergocalciferol, (DRISDOL) 1.25 MG (50000 UNIT) CAPS capsule TAKE 1 CAPSULE BY MOUTH ONCE WEEKLY 11/07/22   Levert Feinstein, MD      Allergies    Ambien [zolpidem] and Prednisone    Review of Systems   Review of Systems  Gastrointestinal:  Positive for abdominal pain.  All other systems reviewed and are negative.   Physical Exam Updated Vital Signs BP (!) 131/95   Pulse 84   Temp 97.6 F (36.4 C) (Oral)   Resp 18   Ht 5\' 3"  (1.6 m)   Wt 79.4 kg   SpO2 96%   BMI 31.00 kg/m  Physical Exam Vitals and nursing note reviewed.  Constitutional:      General: She is not in acute distress.    Appearance: She is well-developed.  HENT:     Head: Normocephalic and atraumatic.  Eyes:     Conjunctiva/sclera: Conjunctivae normal.  Cardiovascular:     Rate and Rhythm: Normal rate and regular rhythm.     Heart sounds: No murmur heard. Pulmonary:     Effort: Pulmonary effort is normal. No respiratory distress.     Breath sounds: Normal breath sounds.  Abdominal:     Palpations: Abdomen is soft.     Tenderness: There is generalized abdominal tenderness.  Musculoskeletal:        General: No swelling.     Cervical back: Neck supple.  Skin:    General: Skin is warm and dry.     Capillary Refill: Capillary refill takes less than 2 seconds.  Neurological:     Mental Status: She is alert.  Psychiatric:        Mood and Affect: Mood normal.     ED Results / Procedures / Treatments    Labs (all labs ordered are listed, but only abnormal results are displayed) Labs Reviewed  CBC WITH DIFFERENTIAL/PLATELET - Abnormal; Notable for the following components:      Result Value   HCT 34.7 (*)    All other components within normal limits  COMPREHENSIVE METABOLIC PANEL - Abnormal; Notable for the following components:   Potassium 3.4 (*)    Glucose, Bld 122 (*)    All other components within normal limits  MAGNESIUM - Abnormal; Notable for the following components:   Magnesium 1.6 (*)    All other components within normal limits  RESP PANEL BY RT-PCR (RSV, FLU A&B, COVID)  RVPGX2  LIPASE, BLOOD  URINALYSIS, ROUTINE W REFLEX MICROSCOPIC  PREGNANCY, URINE  TSH  T4, FREE    EKG EKG Interpretation Date/Time:  Tuesday May 09 2023 10:53:32 EDT Ventricular Rate:  94 PR Interval:  165 QRS Duration:  54 QT Interval:  454 QTC Calculation: 568 R Axis:   56  Text Interpretation: Sinus rhythm Low voltage, precordial leads Prolonged QT interval Confirmed by Ernie Avena ([id number]) on 05/09/2023 10:59:24 AM  Radiology CT ABDOMEN PELVIS W CONTRAST Result Date: 05/09/2023 CLINICAL DATA:  Bowel obstruction suspected. Intermittent generalized abdominal cramping. EXAM: CT ABDOMEN AND PELVIS WITH CONTRAST TECHNIQUE: Multidetector CT imaging of the abdomen and pelvis was performed using the standard protocol following bolus administration of intravenous contrast. RADIATION DOSE REDUCTION: This exam was performed according to the departmental dose-optimization program which includes automated exposure control, adjustment of the mA and/or kV according to patient size and/or use of iterative reconstruction technique. CONTRAST:  OMNIPAQUE IOHEXOL 300 MG/ML  SOLN COMPARISON:  CT scan renal stone protocol from 08/13/2021. FINDINGS: Lower chest: The lung bases are clear. No pleural effusion. The heart is normal in size. No pericardial effusion. Partially seen bilateral breast implants.  Hepatobiliary: The liver is normal in size. Non-cirrhotic configuration. No suspicious mass. These is mild diffuse hepatic steatosis. No intrahepatic or extrahepatic bile duct dilation. No calcified gallstones. Normal gallbladder wall thickness. No pericholecystic inflammatory changes. Pancreas: Unremarkable. No pancreatic ductal dilatation or surrounding inflammatory changes. Spleen: Within normal limits. No focal lesion. Adrenals/Urinary Tract: Adrenal glands are unremarkable. No suspicious renal mass. No hydronephrosis. No renal or ureteric calculi. Urinary bladder is under distended, precluding optimal assessment. However, no large mass or stones identified. No perivesical fat stranding. Stomach/Bowel: No disproportionate dilation of the small or large bowel loops. No evidence of abnormal bowel wall thickening or inflammatory changes. The appendix is unremarkable. Vascular/Lymphatic: There is small amount of free fluid in the dependent pelvis. No walled-off abscess or loculated collection. No pneumoperitoneum. No abdominal or pelvic lymphadenopathy, by  size criteria. No aneurysmal dilation of the major abdominal arteries. There are mild peripheral atherosclerotic vascular calcifications of the aorta and its major branches. Reproductive: The uterus is surgically absent. No large adnexal mass. Other: There is a tiny fat containing umbilical hernia. The soft tissues and abdominal wall are otherwise unremarkable. Musculoskeletal: No suspicious osseous lesions. There are mild multilevel degenerative changes in the visualized spine. IMPRESSION: 1. No acute inflammatory process identified within the abdomen or pelvis. No bowel obstruction. 2. Small amount of free fluid in the dependent pelvis, likely physiological in the patient of this age group. 3. Multiple other nonacute observations, as described above. Aortic Atherosclerosis (ICD10-I70.0). Electronically Signed   By: Jules Schick M.D.   On: 05/09/2023 12:16     Procedures Procedures    Medications Ordered in ED Medications  fentaNYL (SUBLIMAZE) injection 50 mcg (50 mcg Intravenous Given 05/09/23 0844)  potassium chloride SA (KLOR-CON M) CR tablet 20 mEq (20 mEq Oral Given 05/09/23 1007)  magnesium sulfate IVPB 2 g 50 mL (0 g Intravenous Stopped 05/09/23 1143)  iohexol (OMNIPAQUE) 300 MG/ML solution 100 mL (100 mLs Intravenous Contrast Given 05/09/23 0950)  fentaNYL (SUBLIMAZE) injection 50 mcg (50 mcg Intravenous Given 05/09/23 1140)  famotidine (PEPCID) IVPB 20 mg premix (0 mg Intravenous Stopped 05/09/23 1231)  alum & mag hydroxide-simeth (MAALOX/MYLANTA) 200-200-20 MG/5ML suspension 30 mL (30 mLs Oral Given 05/09/23 1231)    And  lidocaine (XYLOCAINE) 2 % viscous mouth solution 15 mL (15 mLs Oral Given 05/09/23 1231)    ED Course/ Medical Decision Making/ A&P Clinical Course as of 05/09/23 1246  Tue May 09, 2023  1100 Magnesium(!): 1.6 [JL]  1100 Potassium(!): 3.4 [JL]    Clinical Course User Index [JL] Ernie Avena, MD                                 Medical Decision Making Amount and/or Complexity of Data Reviewed Labs: ordered. Decision-making details documented in ED Course. Radiology: ordered.  Risk Prescription drug management.    48 year old female with medical history significant for anxiety, hypertension, GERD, ileus on CT who presents to the emergency department with a chief complaint of abdominal pain.  The patient's last bowel movement was yesterday afternoon.  She denies any nausea, vomiting or diarrhea.  She has had intermittent upper abdominal cramping and discomfort since last night.  She last had a meal last night.  She denies any nausea or vomiting.  Her pain is currently a 0 but he is coming in waves and spasms and is severe when they occur.  She states that she is not currently passing gas and has had decreased oral intake today.  Medical Decision Making:   Judith [name] is a 48 y.o. female who presented  to the ED today with abdominal pain, detailed above.     Complete initial physical exam performed, notably the patient  was abdomen that had mild generalized TTP.     Reviewed and confirmed nursing documentation for past medical history, family history, social history.    Initial Assessment:   With the patient's presentation of abdominal pain, most likely diagnosis is SBO vs ileus. Other diagnoses were considered including (but not limited to) gastroenteritis, coliti, appendicitis, cholecystitis, pancreatitis, nephrolithiasis, UTI, pyelonephritis, diverticulitis.These are considered less likely due to history of present illness and physical exam findings.      Initial Plan:  CBC/CMP to evaluate for underlying infectious/metabolic etiology for  patient's abdominal pain  Lipase to evaluate for pancreatitis  EKG to evaluate for cardiac source of pain  CTAB/Pelvis with contrast to evaluate for structural/surgical etiology of patients' severe abdominal pain.  Urinalysis and repeat physical assessment to evaluate for UTI/Pyelonpehritis  Empiric management of symptoms with escalating pain control and antiemetics as needed.   Initial Study Results:   Laboratory  All laboratory results reviewed without evidence of clinically relevant pathology.   Exceptions include: hypokalemia to 3.4 and hypomagnesemia to 1.6    Radiology All images reviewed independently. Agree with radiology report at this time.   CT ABDOMEN PELVIS W CONTRAST Result Date: 05/09/2023 CLINICAL DATA:  Bowel obstruction suspected. Intermittent generalized abdominal cramping. EXAM: CT ABDOMEN AND PELVIS WITH CONTRAST TECHNIQUE: Multidetector CT imaging of the abdomen and pelvis was performed using the standard protocol following bolus administration of intravenous contrast. RADIATION DOSE REDUCTION: This exam was performed according to the departmental dose-optimization program which includes automated exposure control, adjustment of  the mA and/or kV according to patient size and/or use of iterative reconstruction technique. CONTRAST:  OMNIPAQUE IOHEXOL 300 MG/ML  SOLN COMPARISON:  CT scan renal stone protocol from 08/13/2021. FINDINGS: Lower chest: The lung bases are clear. No pleural effusion. The heart is normal in size. No pericardial effusion. Partially seen bilateral breast implants. Hepatobiliary: The liver is normal in size. Non-cirrhotic configuration. No suspicious mass. These is mild diffuse hepatic steatosis. No intrahepatic or extrahepatic bile duct dilation. No calcified gallstones. Normal gallbladder wall thickness. No pericholecystic inflammatory changes. Pancreas: Unremarkable. No pancreatic ductal dilatation or surrounding inflammatory changes. Spleen: Within normal limits. No focal lesion. Adrenals/Urinary Tract: Adrenal glands are unremarkable. No suspicious renal mass. No hydronephrosis. No renal or ureteric calculi. Urinary bladder is under distended, precluding optimal assessment. However, no large mass or stones identified. No perivesical fat stranding. Stomach/Bowel: No disproportionate dilation of the small or large bowel loops. No evidence of abnormal bowel wall thickening or inflammatory changes. The appendix is unremarkable. Vascular/Lymphatic: There is small amount of free fluid in the dependent pelvis. No walled-off abscess or loculated collection. No pneumoperitoneum. No abdominal or pelvic lymphadenopathy, by size criteria. No aneurysmal dilation of the major abdominal arteries. There are mild peripheral atherosclerotic vascular calcifications of the aorta and its major branches. Reproductive: The uterus is surgically absent. No large adnexal mass. Other: There is a tiny fat containing umbilical hernia. The soft tissues and abdominal wall are otherwise unremarkable. Musculoskeletal: No suspicious osseous lesions. There are mild multilevel degenerative changes in the visualized spine. IMPRESSION: 1. No acute  inflammatory process identified within the abdomen or pelvis. No bowel obstruction. 2. Small amount of free fluid in the dependent pelvis, likely physiological in the patient of this age group. 3. Multiple other nonacute observations, as described above. Aortic Atherosclerosis (ICD10-I70.0). Electronically Signed   By: Jules Schick M.D.   On: 05/09/2023 12:16       Final Reassessment and Plan:   The patient's potassium and magnesium was replenished orally and IV. She was provided Fentanyl for pain control and administered IV pepcid. Suspect component of gastritis and/or peptic ulcer disease. Pt passed a PO challenge, administered Malox and Lidocaine. Feeling symptomatically improved after the above interventions. Overall, lab and CT imaging was reassuring. Advised continued hydrated, outpatient GI follow-up, will start on Pepcid, Protonix and Carafate, return precautions provided.   Final Clinical Impression(s) / ED Diagnoses Final diagnoses:  Hypomagnesemia  Hypokalemia  Abdominal cramping  Prolonged Q-T interval on ECG    Rx /  DC Orders ED Discharge Orders          Ordered    dicyclomine (BENTYL) 20 MG tablet  2 times daily        05/09/23 1245    sucralfate (CARAFATE) 1 g tablet  3 times daily with meals & bedtime        05/09/23 1245    famotidine (PEPCID) 20 MG tablet  Daily        05/09/23 1245    Ambulatory referral to Gastroenterology        05/09/23 1245    pantoprazole (PROTONIX) 40 MG tablet  Daily        05/09/23 1245              Ernie Avena, MD 05/09/23 1246

## 2023-05-09 NOTE — ED Triage Notes (Signed)
 Intermittent generalized abd cramping since last night. Denies NVD  States she is being tested for celiac disease w PCP, had pasta last night

## 2023-05-09 NOTE — Discharge Instructions (Addendum)
 Your workup was overall reassuring with a reassuring laboratory evaluation and CT. Please follow-up with outpatient gastroenterology for further management.   CT: IMPRESSION:  1. No acute inflammatory process identified within the abdomen or  pelvis. No bowel obstruction.  2. Small amount of free fluid in the dependent pelvis, likely  physiological in the patient of this age group.  3. Multiple other nonacute observations, as described above.    Aortic Atherosclerosis (ICD10-I70.0).   You had mildly low potassium which was corrected and low magnesium which was also corrected. Your symptoms improved with medication management and could be due to gastritis or a peptic ulcer. Recommend outpatient Gastroenterology follow-up.

## 2023-12-06 ENCOUNTER — Ambulatory Visit: Payer: 59 | Admitting: Adult Health
